# Patient Record
Sex: Female | Born: 1985 | Race: White | Hispanic: No | Smoking: Never smoker
Health system: Southern US, Community
[De-identification: ages and names within clinical notes are randomized; demographics above are authoritative.]

## PROBLEM LIST (undated history)

## (undated) DIAGNOSIS — F419 Anxiety disorder, unspecified: Secondary | ICD-10-CM

## (undated) DIAGNOSIS — R569 Unspecified convulsions: Secondary | ICD-10-CM

## (undated) DIAGNOSIS — A749 Chlamydial infection, unspecified: Secondary | ICD-10-CM

## (undated) DIAGNOSIS — I10 Essential (primary) hypertension: Secondary | ICD-10-CM

## (undated) DIAGNOSIS — K219 Gastro-esophageal reflux disease without esophagitis: Secondary | ICD-10-CM

## (undated) DIAGNOSIS — R519 Headache, unspecified: Secondary | ICD-10-CM

## (undated) DIAGNOSIS — F32A Depression, unspecified: Secondary | ICD-10-CM

## (undated) DIAGNOSIS — A549 Gonococcal infection, unspecified: Secondary | ICD-10-CM

## (undated) DIAGNOSIS — O009 Unspecified ectopic pregnancy without intrauterine pregnancy: Secondary | ICD-10-CM

## (undated) DIAGNOSIS — G4730 Sleep apnea, unspecified: Secondary | ICD-10-CM

## (undated) DIAGNOSIS — K7030 Alcoholic cirrhosis of liver without ascites: Secondary | ICD-10-CM

## (undated) DIAGNOSIS — T7840XA Allergy, unspecified, initial encounter: Secondary | ICD-10-CM

## (undated) DIAGNOSIS — J189 Pneumonia, unspecified organism: Secondary | ICD-10-CM

## (undated) HISTORY — DX: Gastro-esophageal reflux disease without esophagitis: K21.9

## (undated) HISTORY — DX: Allergy, unspecified, initial encounter: T78.40XA

## (undated) HISTORY — PX: COLPOSCOPY: SHX161

## (undated) HISTORY — DX: Headache, unspecified: R51.9

---

## 2011-09-17 ENCOUNTER — Emergency Department: Payer: Self-pay | Admitting: Emergency Medicine

## 2011-09-17 LAB — COMPREHENSIVE METABOLIC PANEL
Anion Gap: 25 — ABNORMAL HIGH (ref 7–16)
BUN: 31 mg/dL — ABNORMAL HIGH (ref 7–18)
Bilirubin,Total: 3 mg/dL — ABNORMAL HIGH (ref 0.2–1.0)
Chloride: 92 mmol/L — ABNORMAL LOW (ref 98–107)
Co2: 17 mmol/L — ABNORMAL LOW (ref 21–32)
Creatinine: 1.12 mg/dL (ref 0.60–1.30)
EGFR (African American): >60
EGFR (Non-African Amer.): >60
Glucose: 61 mg/dL — ABNORMAL LOW (ref 65–99)
Osmolality: 273 (ref 275–301)
Potassium: 4.1 mmol/L (ref 3.5–5.1)
Sodium: 134 mmol/L — ABNORMAL LOW (ref 136–145)
Total Protein: 8.8 g/dL — ABNORMAL HIGH (ref 6.4–8.2)

## 2011-09-17 LAB — CBC
HCT: 41.2 % (ref 35.0–47.0)
HGB: 14.1 g/dL (ref 12.0–16.0)
MCH: 37.4 pg — ABNORMAL HIGH (ref 26.0–34.0)
MCHC: 34.1 g/dL (ref 32.0–36.0)
MCV: 110 fL — ABNORMAL HIGH (ref 80–100)
RBC: 3.76 10*6/uL — ABNORMAL LOW (ref 3.80–5.20)
RDW: 13.9 % (ref 11.5–14.5)

## 2011-12-13 LAB — CBC
HCT: 33.3 % — ABNORMAL LOW (ref 35.0–47.0)
MCH: 37.1 pg — ABNORMAL HIGH (ref 26.0–34.0)
MCV: 109 fL — ABNORMAL HIGH (ref 80–100)
Platelet: 73 10*3/uL — ABNORMAL LOW (ref 150–440)
RBC: 3.07 10*6/uL — ABNORMAL LOW (ref 3.80–5.20)
WBC: 2.6 10*3/uL — ABNORMAL LOW (ref 3.6–11.0)

## 2011-12-13 LAB — COMPREHENSIVE METABOLIC PANEL
Albumin: 3 g/dL — ABNORMAL LOW (ref 3.4–5.0)
Alkaline Phosphatase: 150 U/L — ABNORMAL HIGH (ref 50–136)
Bilirubin,Total: 5.5 mg/dL — ABNORMAL HIGH (ref 0.2–1.0)
Calcium, Total: 8.5 mg/dL (ref 8.5–10.1)
Co2: 25 mmol/L (ref 21–32)
Creatinine: 0.57 mg/dL — ABNORMAL LOW (ref 0.60–1.30)
EGFR (African American): >60
EGFR (Non-African Amer.): >60
Glucose: 87 mg/dL (ref 65–99)
Potassium: 3.5 mmol/L (ref 3.5–5.1)
SGOT(AST): 254 U/L — ABNORMAL HIGH (ref 15–37)
Sodium: 140 mmol/L (ref 136–145)
Total Protein: 7.4 g/dL (ref 6.4–8.2)

## 2011-12-13 LAB — LIPASE, BLOOD: Lipase: 124 U/L (ref 73–393)

## 2011-12-14 ENCOUNTER — Inpatient Hospital Stay: Payer: Self-pay | Admitting: Internal Medicine

## 2011-12-14 LAB — URINALYSIS, COMPLETE
Leukocyte Esterase: NEGATIVE
Nitrite: NEGATIVE
Ph: 6 (ref 4.5–8.0)
Protein: 30
RBC,UR: 1 /HPF (ref 0–5)
Specific Gravity: 1.024 (ref 1.003–1.030)
WBC UR: 38 /HPF (ref 0–5)

## 2011-12-14 LAB — PHOSPHORUS: Phosphorus: 2.8 mg/dL (ref 2.5–4.9)

## 2011-12-14 LAB — IRON AND TIBC
Iron Saturation: 48 %
Unbound Iron-Bind.Cap.: 83 ug/dL

## 2011-12-14 LAB — MAGNESIUM: Magnesium: 1.2 mg/dL — ABNORMAL LOW

## 2011-12-14 LAB — HEPATIC FUNCTION PANEL A (ARMC)
Alkaline Phosphatase: 167 U/L — ABNORMAL HIGH (ref 50–136)
SGPT (ALT): 44 U/L
Total Protein: 7.2 g/dL (ref 6.4–8.2)

## 2011-12-14 LAB — PREGNANCY, URINE: Pregnancy Test, Urine: NEGATIVE m[IU]/mL

## 2011-12-15 LAB — CBC WITH DIFFERENTIAL/PLATELET
Eosinophil %: 0.9 %
HGB: 10.5 g/dL — ABNORMAL LOW (ref 12.0–16.0)
Lymphocyte #: 0.9 10*3/uL — ABNORMAL LOW (ref 1.0–3.6)
MCHC: 34.2 g/dL (ref 32.0–36.0)
Monocyte #: 0.2 10*3/uL (ref 0.0–0.7)
Monocyte %: 6.8 %
Neutrophil %: 54.3 %
Platelet: 70 10*3/uL — ABNORMAL LOW (ref 150–440)
WBC: 2.5 10*3/uL — ABNORMAL LOW (ref 3.6–11.0)

## 2011-12-15 LAB — HEPATIC FUNCTION PANEL A (ARMC)
Albumin: 2.6 g/dL — ABNORMAL LOW (ref 3.4–5.0)
Alkaline Phosphatase: 142 U/L — ABNORMAL HIGH (ref 50–136)
Bilirubin, Direct: 2.7 mg/dL — ABNORMAL HIGH (ref 0.00–0.20)
Bilirubin,Total: 3.6 mg/dL — ABNORMAL HIGH (ref 0.2–1.0)
SGOT(AST): 255 U/L — ABNORMAL HIGH (ref 15–37)
SGPT (ALT): 48 U/L
Total Protein: 6.8 g/dL (ref 6.4–8.2)

## 2011-12-15 LAB — BASIC METABOLIC PANEL
Anion Gap: 13 (ref 7–16)
BUN: 2 mg/dL — ABNORMAL LOW (ref 7–18)
Calcium, Total: 8.4 mg/dL — ABNORMAL LOW (ref 8.5–10.1)
EGFR (African American): >60
EGFR (Non-African Amer.): >60
Osmolality: 278 (ref 275–301)
Sodium: 142 mmol/L (ref 136–145)

## 2011-12-15 LAB — PROTIME-INR: INR: 1.8

## 2011-12-16 LAB — CBC WITH DIFFERENTIAL/PLATELET
Basophil #: 0 10*3/uL (ref 0.0–0.1)
Basophil %: 0.4 %
HCT: 29.5 % — ABNORMAL LOW (ref 35.0–47.0)
Lymphocyte #: 0.9 10*3/uL — ABNORMAL LOW (ref 1.0–3.6)
Lymphocyte %: 36.7 %
MCH: 37.3 pg — ABNORMAL HIGH (ref 26.0–34.0)
MCHC: 34.1 g/dL (ref 32.0–36.0)
MCV: 109 fL — ABNORMAL HIGH (ref 80–100)
Monocyte %: 8 %
Neutrophil #: 1.2 10*3/uL — ABNORMAL LOW (ref 1.4–6.5)
Neutrophil %: 53.6 %
Platelet: 66 10*3/uL — ABNORMAL LOW (ref 150–440)
RBC: 2.7 10*6/uL — ABNORMAL LOW (ref 3.80–5.20)
RDW: 19.8 % — ABNORMAL HIGH (ref 11.5–14.5)

## 2011-12-16 LAB — BASIC METABOLIC PANEL
Anion Gap: 12 (ref 7–16)
BUN: 2 mg/dL — ABNORMAL LOW (ref 7–18)
Calcium, Total: 8.1 mg/dL — ABNORMAL LOW (ref 8.5–10.1)
Creatinine: 0.62 mg/dL (ref 0.60–1.30)
Glucose: 74 mg/dL (ref 65–99)

## 2011-12-16 LAB — HEPATIC FUNCTION PANEL A (ARMC)
Albumin: 2.5 g/dL — ABNORMAL LOW (ref 3.4–5.0)
SGPT (ALT): 49 U/L
Total Protein: 6.3 g/dL — ABNORMAL LOW (ref 6.4–8.2)

## 2011-12-16 LAB — PROTIME-INR: Prothrombin Time: 20.7 secs — ABNORMAL HIGH (ref 11.5–14.7)

## 2011-12-19 ENCOUNTER — Ambulatory Visit: Payer: Self-pay | Admitting: Unknown Physician Specialty

## 2012-01-10 ENCOUNTER — Ambulatory Visit: Payer: Self-pay | Admitting: Unknown Physician Specialty

## 2012-02-09 LAB — RAPID URINE DRUG SCREEN, HOSP PERFORMED
Benzodiazepine, Ur Scrn: POSITIVE (ref ?–200)
Cannabinoid 50 Ng, Ur ~~LOC~~: POSITIVE (ref ?–50)
Opiate, Ur Screen: NEGATIVE (ref ?–300)

## 2012-02-10 ENCOUNTER — Ambulatory Visit: Payer: Self-pay | Admitting: Unknown Physician Specialty

## 2012-03-25 DIAGNOSIS — F419 Anxiety disorder, unspecified: Secondary | ICD-10-CM | POA: Insufficient documentation

## 2012-10-04 ENCOUNTER — Ambulatory Visit: Payer: Self-pay | Admitting: Unknown Physician Specialty

## 2013-08-01 ENCOUNTER — Emergency Department: Payer: Self-pay | Admitting: Emergency Medicine

## 2013-08-01 LAB — CBC WITH DIFFERENTIAL/PLATELET
Basophil #: 0 10*3/uL (ref 0.0–0.1)
Basophil %: 0.5 %
Eosinophil #: 0.1 10*3/uL (ref 0.0–0.7)
Eosinophil %: 1.8 %
HCT: 38.7 % (ref 35.0–47.0)
Lymphocyte #: 1.8 10*3/uL (ref 1.0–3.6)
Lymphocyte %: 27.5 %
MCHC: 35.3 g/dL (ref 32.0–36.0)
Monocyte #: 0.5 x10 3/mm (ref 0.2–0.9)
Monocyte %: 7 %
Neutrophil %: 63.2 %
Platelet: 181 10*3/uL (ref 150–440)
RBC: 4.2 10*6/uL (ref 3.80–5.20)

## 2013-08-01 LAB — COMPREHENSIVE METABOLIC PANEL WITH GFR
Albumin: 4.1 g/dL
Alkaline Phosphatase: 91 U/L
Anion Gap: 5 — ABNORMAL LOW
BUN: 22 mg/dL — ABNORMAL HIGH
Bilirubin,Total: 0.7 mg/dL
Calcium, Total: 9.2 mg/dL
Chloride: 107 mmol/L
Co2: 25 mmol/L
Creatinine: 0.65 mg/dL
EGFR (African American): >60
EGFR (Non-African Amer.): >60
Glucose: 112 mg/dL — ABNORMAL HIGH
Osmolality: 278
Potassium: 4 mmol/L
SGOT(AST): 31 U/L
SGPT (ALT): 31 U/L
Sodium: 137 mmol/L
Total Protein: 7.6 g/dL

## 2013-09-11 HISTORY — PX: CHOLECYSTECTOMY: SHX55

## 2013-10-06 DIAGNOSIS — K219 Gastro-esophageal reflux disease without esophagitis: Secondary | ICD-10-CM | POA: Insufficient documentation

## 2013-10-06 DIAGNOSIS — Z516 Encounter for desensitization to allergens: Secondary | ICD-10-CM | POA: Insufficient documentation

## 2013-10-06 DIAGNOSIS — J302 Other seasonal allergic rhinitis: Secondary | ICD-10-CM | POA: Insufficient documentation

## 2014-07-31 ENCOUNTER — Ambulatory Visit: Payer: Self-pay | Admitting: Family Medicine

## 2014-08-26 ENCOUNTER — Ambulatory Visit: Payer: Self-pay | Admitting: Surgery

## 2014-08-28 ENCOUNTER — Ambulatory Visit: Payer: Self-pay | Admitting: Surgery

## 2015-01-02 NOTE — Op Note (Signed)
PATIENT NAME:  [name], [name] MR#:  [id number] DATE OF BIRTH:  [birth year]  DATE OF PROCEDURE:  08/28/2014  PREOPERATIVE DIAGNOSIS: Chronic cholecystitis/cholelithiasis.   POSTOPERATIVE DIAGNOSIS: Chronic cholecystectomy/cholelithiasis.    PROCEDURE: Laparoscopic cholecystectomy and cholangiogram.   SURGEON: Adella HareJ. Wilton Rusnak, MD   ANESTHESIA: General.   INDICATIONS: This 29 year old female has a history of the right upper quadrant abdominal pains and ultrasound findings of a gallstone and moderate elevation of bilirubin. Recent MRCP demonstrated no common bile duct stone. Surgery was recommended for definitive treatment.   DESCRIPTION OF PROCEDURE: The patient was placed on the operating table in the supine position under general anesthesia. The abdomen was prepared with ChloraPrep and draped in a sterile manner. A short incision was made in the inferior aspect of the umbilicus and carried down to the deep fascia, which was grasped with laryngeal hook and elevated. A Veress needle was inserted, aspirated, and irrigated with a saline solution. Next, the peritoneal cavity was inflated with carbon dioxide. The Veress needle was removed. The 10 mm cannula was inserted. The 10 mm, 0 degree laparoscope was inserted to view the peritoneal cavity. Initial inspection revealed a smooth surface of the liver and some white discoloration of the fundus of the gallbladder. A brief survey of the stomach and intestines appeared typical.   Next, another incision was made in the epigastrium, slightly to the right of the midline, to introduce an 11 mm cannula. Two incisions were made in the lateral aspect of the right upper quadrant to introduce two 5 mm cannulas. With the patient in the reverse Trendelenburg position and turned several degrees towards the left. The gallbladder was retracted towards the right shoulder. The infundibulum was retracted inferiorly and laterally. The porta hepatis was demonstrated. It was  further noted that this liver surface appeared smooth and the liver was soft. Next, dissection was carried out, mobilizing the gallbladder neck with incision of the visceral peritoneum. The cystic duct was dissected free from surrounding structures. The cystic artery was dissected free from surrounding structures. A critical view of safety was demonstrated.   An Endo Clip was placed across the cystic duct adjacent to the neck of the gallbladder. An incision was made in the cystic duct to introduce a Reddick catheter. Half-strength Conray-60 dye was injected, as the cholangiogram was done with fluoroscopy. Viewing the biliary tree and prompt flow of dye into the duodenum, no retained stones were seen. The cholangiogram appeared normal.   The Reddick catheter was removed. The cystic duct was doubly ligated with Endoclips and divided. The cystic artery was controlled with double Endoclips and divided. The gallbladder was dissected free from the liver with hook and cautery. There was no significant bleeding. The gallbladder was completely separated and was delivered up through the infraumbilical port, opened and suctioned, removed and submitted with a small palpable stone for routine pathology. The right upper quadrant was further inspected. Hemostasis was intact. The cannulas were removed. Carbon dioxide was allowed to escape from the peritoneal cavity. Several small subcutaneous bleeding points were cauterized. The skin incisions were closed with interrupted 5-0 chromic subcuticular suture, benzoin, and Steri-Strips. Dressings were applied with paper tape.   The patient tolerated surgery satisfactorily and was then prepared for transfer to the recovery room.    ____________________________ Shela CommonsJ. Renda RollsWilton Fera, MD jws:MT D: 08/28/2014 14:23:03 ET T: 08/28/2014 21:05:04 ET JOB#: [id number]  cc: Adella HareJ. Wilton Gelb, MD, <Dictator> Adella HareWILTON J Yankowski MD ELECTRONICALLY SIGNED 08/31/2014 10:46
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
## 2015-01-03 NOTE — Consult Note (Signed)
Spoke to patient about alcohol and the damage she is doing to her liver.  Patient very manic and delusional thinking people are trying to kill her.  She bolted for the stairs and required staff to bring her back to room and code 300 called.  Psy notified of stat consult. No specific liver treatents at this time.  Check phosphorous and magnesium levels.   Electronic Signatures: Scot JunElliott, Robert T (MD)  (Signed on 04-Apr-13 15:59)  Authored  Last Updated: 04-Apr-13 15:59 by Scot JunElliott, Robert T (MD)

## 2015-01-03 NOTE — Consult Note (Signed)
Chief Complaint:   Subjective/Chief Complaint feeling some better today, denies nausea, tolerting po, denies abdominal pain. tolerating po   Brief Assessment:   Cardiac Regular    Respiratory clear BS    Gastrointestinal details normal Soft  Nontender  Nondistended  No masses palpable  Bowel sounds normal   Hepatic:  03-Apr-13 19:52    Bilirubin, Total 5.5    23:40    Bilirubin, Total 5.1   Bilirubin, Direct 3.2  Routine Coag:  04-Apr-13 23:40    INR 1.8  Routine Hem:  05-Apr-13 05:10    WBC (CBC) 2.5   RBC (CBC) 2.85   Hemoglobin (CBC) 10.5   Hematocrit (CBC) 30.8   Platelet Count (CBC) 70   MCV 108   MCH 36.9   MCHC 34.2   RDW 19.3  Routine Chem:  05-Apr-13 05:10    Glucose, Serum 68   BUN 2   Creatinine (comp) 0.64   Sodium, Serum 142   Potassium, Serum 3.1   Chloride, Serum 104   CO2, Serum 25   Calcium (Total), Serum 8.4  Hepatic:  05-Apr-13 05:10    Bilirubin, Total 3.6   Alkaline Phosphatase 142   SGPT (ALT) 48   SGOT (AST) 255   Total Protein, Serum 6.8   Albumin, Serum 2.6  Routine Chem:  05-Apr-13 05:10    Osmolality (calc) 278   eGFR (African American) >60   eGFR (Non-African American) >60   Anion Gap 13  Hepatic:  05-Apr-13 05:10    Bilirubin, Direct 2.7  Routine Coag:  05-Apr-13 05:10    Prothrombin 21.1   INR 1.8  Routine Hem:  05-Apr-13 05:10    Neutrophil % 54.3   Lymphocyte % 37.4   Monocyte % 6.8   Eosinophil % 0.9   Basophil % 0.6   Neutrophil # 1.3   Lymphocyte # 0.9   Monocyte # 0.2   Eosinophil # 0.0   Basophil # 0.0   Assessment/Plan:  Assessment/Plan:   Assessment 1) etoh related hepatopathy-some improvement of bili, stable pt, now on vit K to check responsiveness. please see full GI note earlier    Plan 1) continue supportive care.  appreciate psych assistance.   Electronic Signatures: Loistine Simas (MD)  (Signed 06-Apr-13 00:42)  Authored: Chief Complaint, Brief Assessment, Lab Results,  Assessment/Plan   Last Updated: 06-Apr-13 00:42 by Loistine Simas (MD)
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
## 2015-01-03 NOTE — H&P (Signed)
PATIENT NAME:  [name], [name] MR#:  [id number] DATE OF BIRTH:  11-28-1985  DATE OF ADMISSION:  12/14/2011  CHIEF COMPLAINT: Generalized weakness, fatigue.  HISTORY OF PRESENT ILLNESS: Ms. [name] is a 29 year old female with alcoholic liver cirrhosis who has her primary doctor, Dr. Warnell Forester at [facility]. Patient has been followed by [facility], was recently diagnosed with alcoholic liver disease. Patient has history of heavy alcohol abuse, started drinking recently only last three years but drinks up to one pint of vodka per day and she relates this to stress at job. Upon admission patient had basic workup done which did show ammonia level of 49 and albumin level of 3 and total bilirubin of 5.5 and alkaline phosphatase of 150 and AST of 254 as well did show pancytopenia with WBCs of 2.6, hemoglobin of 11.4 and platelet count of 73 and INR level of 1.8. These labs certainly do look worse than her previous labs mainly for INR which was elevated from 1.3 to 1.8 and total bilirubin which was 3.4 at [facility] and elevated 5.5.   As well patient was complaining of abdominal tenderness which she reports much improved by now. She reports she had lower extremity edema over the last couple of days but as well did resolve recently as per patient and she feels slightly confused and lethargic.   PAST MEDICAL HISTORY: 1. Alcoholic liver cirrhosis.  2. Morbid obesity status post 90 pound weight loss over a year. 3. History of seizures. 4. History of diabetes. 5. History of hypertension, currently not taking any medication, controlled with diet.   HOME MEDICATIONS: 1. Xanax 0.25 mg t.i.d. 2. Propranolol 10 mg p.o. q.i.d.  3. Omeprazole 20 mg p.o. daily.  SOCIAL HISTORY: Patient lives with her husband. 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 She reports history of heavy drinking over the last year, up to one pint of vodka daily. She smokes marijuana occasionally. She denies any drug abuse or  any smoking.   FAMILY HISTORY: Significant for hypertension in patient's father.   ALLERGIES: NyQuil Cold and Flu.   REVIEW OF SYSTEMS: CONSTITUTIONAL: Patient denies any fever. Complains of weakness, fatigue. EYES: Denies any blurred vision, double vision. ENT: Denies any tinnitus, ear pain or hearing loss. RESPIRATORY: Denies any cough, wheezing, hemoptysis. CARDIOVASCULAR: Denies any chest pain, palpitations, syncope. Complains of worsening edema. GASTROINTESTINAL: Denies any nausea, vomiting, diarrhea. Complains of abdominal pain. ENDOCRINE: Denies any polyuria, polydipsia, heat or cold intolerance. HEMATOLOGY: Denies any easy bruising, bleeding diathesis. SKIN: Complains of rash,itching. Denies any acne. NEUROLOGICAL: Denies any numbness, weakness. Has history of seizure. Denies any vertigo.    PHYSICAL EXAMINATION: VITAL SIGNS: Temperature 98.6, pulse 77, respiratory rate 16, blood pressure 118/74, saturation 98% on room air.  GENERAL: Awake, alert young female sitting comfortable in bed in no apparent distress.  HEENT: Head atraumatic, normocephalic. Pupils are equal, round, and reactive to light. Has icteric sclera. Moist oral mucosa.  NECK: Supple. No thyromegaly. No JVD.  CHEST: Good air entry bilaterally. No wheezing, rales, rhonchi.   CARDIOVASCULAR: S1, S2. No rubs, murmur, gallops.   ABDOMEN: Soft, nontender, nondistended. Bowel sounds present. No ascites could be felt.   EXTREMITIES: No edema. No clubbing. No cyanosis. Motor strength 5/5 in all extremities.   PSYCH: Appropriate affect. Mildly lethargic. Comfortable. Awake, alert x3.   LABORATORY, DIAGNOSTIC AND RADIOLOGICAL DATA: Glucose 87, BUN 2, creatinine 0.57, sodium 140, potassium 3.5, chloride 100, carbon dioxide 25, ammonia 49, total protein 7.4,  albumin 3, total bilirubin 5.5, alkaline phosphatase 150, AST 254, ALT 47, WBC 2.6, hemoglobin 11.4, platelets 73, INR 1.8.   CT abdomen and pelvis: Mild intralobular  septal thickening in the lung bases which can be seen with pulmonary edema. Fatty infiltration of the liver with associated hepatomegaly. Small amount of ascites in the abdomen and pelvis. Splenomegaly. Mild diffuse subcutaneous edema suggesting anasarca.   ASSESSMENT AND PLAN:  1. Alcoholic liver disease. Patient has known history of alcoholic liver disease secondary to alcohol consumption, [facility]. Patient appears to be having alcoholic liver disease as her INR is elevated, albumin level is low and total bilirubin increased from 3 to 5.5. Patient was counseled about quitting alcohol. Will obtain ultrasound of the liver. Will consult GI services. Will continue patient on propranolol. Will add Lactulose as patient had elevated ammonia. 2. Metabolic encephalopathy. Patient has lethargy and confusion and this might be attributed to her elevated ammonia level. Started on Lactulose.  3. Thrombocytopenia. This is most likely secondary to splenomegaly. Will hold chemical anticoagulation. 4. Alcohol abuse. Patient was counseled and will start alcohol withdrawal protocol.  5. CODE STATUS: Patient is FULL CODE.   ____________________________ Starleen Armsawood S. Yola Paradiso, MD dse:cms D: 12/14/2011 07:29:19 ET T: 12/14/2011 08:44:52 ET JOB#: [id number]  cc: Starleen Armsawood S. [employer]aye [employer], MD, <Dictator> Dr. Warnell ForesterKimberly Kyliste [facility] Kerekes S Carle Dargan MD ELECTRONICALLY SIGNED 12/23/2011 1:24

## 2015-01-03 NOTE — Consult Note (Signed)
Brief Consult Note: Diagnosis: delirium prob largely due to alcohol withdrawl and also some hepatic encephalopathy.   Patient was seen by consultant.   Consult note dictated.   Recommend further assessment or treatment.   Orders entered.   Comments: Psychiatry: Patient seen. 29yo woman here for eval and treatment of liver disease has rapidly developed agitation. Became paranoid and agitated, tried to elope, pulled out IV. Pt indicates she has heard some things that are prob hallucinations and got suddenly paranoid, but is not showing any formed delusions and not making any threats. Confusion seems to wax and wane. Bottom line, most likely delirium tremons worsened by her hepatic disease. Best treatment is aggressive ativan to acheive calm but not overly sedated state and keep her there for at least a day or two. Orders done. Discussed with nursing team. I'd suggest letting her husband stay with her. Will follow.  Electronic Signatures: Audery Amellapacs, John T (MD)  (Signed 04-Apr-13 16:31)  Authored: Brief Consult Note   Last Updated: 04-Apr-13 16:31 by Audery Amellapacs, John T (MD)

## 2015-01-03 NOTE — Discharge Summary (Signed)
PATIENT NAME:  [name], [name] MR#:  [id number] DATE OF BIRTH:  Nov 01, 1985  DATE OF ADMISSION:  12/14/2011 DATE OF DISCHARGE:  12/16/2011  ADMITTING PHYSICIAN: Dr. Huey Bienenstock DISCHARGING PHYSICIAN: Dr. Enid Baas  PRIMARY CARE PHYSICIAN: None  CONSULTATIONS IN THE HOSPITAL:  1. GI consultation by Dr. Mechele Collin.  2. Psychiatric consultation by Dr. Toni Amend.  DISCHARGE DIAGNOSES:  1. Progressively worsening alcoholic liver disease.  2. Delirium tremens.  3. Ongoing alcohol abuse.  4. Acquired coagulopathy.  5. Hepatic encephalopathy.  6. Chronic thrombocytopenia.  7. Splenomegaly. 8. Portal hypertension.   DISCHARGE HOME MEDICATIONS:  1. Propranolol 10 mg p.o. t.i.d.  2. Lactulose 30 mL p.o. b.i.d.  3. Aldactone 25 mg p.o. daily.  4. Melatonin 3 mg p.o. at bedtime for sleep.   DISCHARGE DIET: Low sodium diet.   DISCHARGE ACTIVITY: As tolerated.   FOLLOW UP INSTRUCTIONS:  1. Primary care physician follow up in 1 to 2 weeks.  2. Alcohol abuse help with outpatient program as recommended by substance abuse counselor prior to discharge.   LABORATORY, DIAGNOSTIC AND RADIOLOGICAL DATA:  INR 1.7 prior to discharge.   Sodium 144, potassium 3.2, chloride 106, bicarbonate 26, BUN 2, creatinine 0.62, glucose 94, calcium 8.1.   Total bilirubin 3.2, with direct bilirubin 2.4, ALT 49, AST 252, alkaline phosphatase 141 and albumin 2.5.   WBC 2.3, hemoglobin 10.1, hematocrit 29.2, platelet count 66. Plasma ammonia slightly elevated at 38.   Abdominal ultrasound showing gallbladder wall thickening, which could be secondary to ascites. No further evidence of any cholecystitis present. Cirrhotic changes in the liver and also findings concerning for portal hypertension particularly with enlarged spleen.   Serum iron 71, iron binding capacity 160. ANA negative. Chest x-ray showing no pneumonia or congestive heart failure. Hyperinflation is present.   CT of the abdomen and pelvis  showing diffuse fatty infiltration of liver, splenomegaly, hepatobiliary disease could be considered, ascites, anasarca and mild thickening of right transverse colon. Colitis cannot be excluded.   Urinalysis negative for any infection. Pregnancy test is negative. Lipase 124.   BRIEF HOSPITAL COURSE: Ms. [name] is a 29 year old young Caucasian female with significant history of alcohol abuse resulting in alcoholic liver disease, cirrhosis, portal hypertension with splenomegaly who follows up at [facility] comes to the hospital complaining of generalized weakness and also fatigue. Her LFTs were slightly elevated on admission which have come down to her baseline.   No changes have been made. She was found to be in acquired coagulopathy with elevated INR so vitamin K subcutaneous three doses were given while in the hospital. She had an acute episode of delirium after she got upset talking to GI physician and tried to elope. She was seen by psychiatry who recommended observation for another 24 to 48 hours for possible delirium tremens as patient also having some hallucinations at the time. After that episode her course has been completely uneventful. She has not required much Ativan and has not gone into withdrawal symptoms. Her appetite has improved and as mentioned above labs are at baseline. Slightly elevated ammonia level so started on lactulose prior to discharge. Also significantly counseled against alcohol abuse and she acknowledged her problem and was motivated to seek help as recommended by substance abuse counselor while inpatient. She is also started on spironolactone for ascites and also propranolol because of increased portal pressures and possible variceal disease. Course has been otherwise uneventful.   DISCHARGE CONDITION: Stable.   DISCHARGE DISPOSITION: Home.   TIME SPENT ON DISCHARGE: 45  minutes.  ____________________________ Enid Baasadhika Thresia Ramanathan, MD rk:cms D: 12/17/2011 12:17:14  ET T: 12/18/2011 14:26:01 ET JOB#: [id number]  cc: Enid Baasadhika Ramia Sidney, MD, <Dictator> Enid BaasADHIKA Jamarian Jacinto MD ELECTRONICALLY SIGNED 12/20/2011 13:43
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
## 2015-01-03 NOTE — Consult Note (Signed)
Brief Consult Note: Diagnosis: Decompensated alcoholic liver disease  Patient was seen by consultant.   Consult note dictated.  Electronic Signatures: Rowan BlaseMills, Alexiz Cothran Ann (NP)  (Signed 04-Apr-13 13:37)  Authored: Brief Consult Note   Last Updated: 04-Apr-13 13:37 by Rowan BlaseMills, Jacarius Handel Ann (NP)

## 2015-01-03 NOTE — Consult Note (Signed)
PATIENT NAME:  Martha, York MR#:  [id number] DATE OF BIRTH:  [birth year]  DATE OF CONSULTATION:  12/14/2011  REFERRING PHYSICIAN:  Dr. Randol Kern CONSULTING PHYSICIAN:  Dr. Molly Maduro Elliott/ Cala Bradford A. Arvilla Market, ANP  REASON FOR CONSULTATION: Alcoholic liver cirrhosis.   HISTORY OF PRESENT ILLNESS: This 29 year old patient of Dr. Warnell Forester, [facility], is followed for alcoholic liver disease. The patient reports alcoholic use started three years ago, and drinks up to 1 pint of vodka daily. She drinks shots of vodka followed by Crystal light. The patient reports she was sober the entire month of February but restarted alcohol again last month. On Monday 12/11/2011 she drank three times as much as she normally did all in one day. She reports the cause of this drinking was frustration at work. Monday she felt a little sweaty. Tuesday she noted leg edema and some progressive abdominal discomfort and felt that it was hard to breathe. She presented to the hospital with ammonia level 49, and decompensated alcoholic cirrhosis. The patient was subsequently admitted, she was placed on CIWA protocol, and gastroenterology has been asked to see the patient for further evaluation and management.   Currently, the patient had a CT scan, has been on lactulose, and has reported a couple of loose stools. Abdomen is feeling overall better. The edema is down. She says she is having no significant pain. The patient believes she vomited once today. She has seen no blood or melena. Denies history of upper endoscopy. The patient reports bad anxiety and OCD and has been followed by psychiatrist Dr. Pricilla Holm for about seven years at [facility].  She denies history of bipolar depression. She had one seizure episode in 2012 and she relates that to being on chronic Wellbutrin since age 77. The patient does have a history of polysubstance abuse, but reports currently the only drugs she is using are occasional marijuana and alcohol. No prior  hospitalizations at this facility and we do not have records from [facility] at the time of consultation.   PAST MEDICAL HISTORY:  1. Alcoholic liver cirrhosis.  2. Morbid obesity, reported 90-pound weight loss over a year.  3. History of seizure times one April 2012 per patient report.  4. The patient denies diabetes history.  5. History of hypertension on no medication.  6. History of psychiatric disorder. The patient reports severe anxiety and OCD, and was placed on Wellbutrin at age 81. The patient has used Klonopin in the past and presents on Xanax  as therapy. The patient states she is going to be looking for another psychiatrist.   PAST SURGICAL HISTORY: None listed.   HOME MEDICATIONS:  1. Xanax 0.25 mg t.i.d.  2. Propranolol 10 mg p.o. q.i.d.  3. Omeprazole 20 mg daily.   ALLERGIES: NyQuil Cold and Flu- causes seizures.   SOCIAL HISTORY: Patient lives with her husband, reports no children. She used to work for [employer], states she quit her job yesterday. Positive acid uses at age 20. Positive cocaine in the past, quit at age 74. She denies history of IV drug use past or present. She has occasional marijuana use. Drinks up to 1 pint of vodka daily. Denies tobacco use.   FAMILY HISTORY: Mother drinks wine daily, father drinks occasional Christiane Ha. She denies family history of liver disease.   REVIEW OF SYSTEMS: 10 systems reviewed and the patient is a wandering historian. The patient is confused, goes on tangents, and is difficult to follow. She denies any headaches, seizure activity, or  change in vision. Mouth is dry. No chest pain, shortness of breath, or cough. Abdomen is more uncomfortable, has been more distended. Denies any nausea or vomiting currently. Bowel movements currently loose. She has noted no blood or melena or change in habits before admission. The patient denies any dysuria or hematuria. Main concern has been swelling in the legs and abdomen. Leg  swelling has decreased since admission. She is noticing easy bruising, has noted no blood in the urine or bleeding gums. No joint pain, swelling, or skin rashes. The patient reports her mental status is more anxious than normal but not too far off baseline. Denies suicidal ideation. Denies history of bipolar depression. The patient admits that she has alcohol problems and associates it with stress related to work.   PHYSICAL EXAMINATION:  VITAL SIGNS: Temperature 98.8, pulse 85, respirations 20, blood pressure 138/99, pulse oximetry on room air 98%.   GENERAL: Obese Caucasian female sitting cross-legged in bed, in no acute distress.   HEENT: Head is normocephalic. Conjunctivae a little injected, positive scleral icterus. Oral mucosa is dry. Tongue is red, coated with white, maybe thrush.   NECK: Supple without lymphadenopathy or thyromegaly.   CARDIAC: S1, S2 without murmur or gallop.   LUNGS: Clear to auscultation bilaterally, respirations are eupneic.   ABDOMEN: Soft, positive ascites, positive normal bowel sounds. Mild tenderness in the right upper quadrant. Fullness in the right upper quadrant felt. No nodules or discrete mass.   RECTAL:  Exam is deferred.   EXTREMITIES: Edema. Positive jaundice, bruising. No cyanosis.   MUSCULOSKELETAL: Strength is equal bilaterally. She is ambulatory. Gait not evaluated. No joint pain, swelling noted.   NEURO: The patient is without tremors, seizure activity. Cranial nerves II through XII grossly intact.   PSYCH: The patient does not appear lethargic. She does appear anxious. Speech is fast. She is an extreme wandering historian with questions. She cannot stay on topic, speaks very quickly, and goes off into conversation that has nothing to do with the question asked. The patient has to be repeatedly redirected to the question, and then she will eventually answer it. Speech pattern to consider a manic state. The patient's extremities are still, she is  sitting in bed, not fidgeting. The patient does respond to question redirection well, and is cooperative and pleasant.   LABORATORY, DIAGNOSTIC, AND RADIOLOGICAL DATA: Admission glucose 87, BUN 2, creatinine 0.57. Electrolytes unremarkable. Lipase 124, ammonia level 49, albumin 3, down from 4.4, total bilirubin 5.5 to 5.1, direct bilirubin 3.2, alkaline phosphatase 167, AST 255, ALT 44, hemoglobin 11.4, WBC 2.6, platelet count 73, MCV 109, MCH 37.1. ProTime 21.2, INR 1.8, PTT 38.1. Urinalysis: Red, cloudy, positive for bilirubin, protein, WBCs 38 per high-power field, positive for mucus, hyaline casts, and epithelial cells.   [facility] with BUN 31, creatinine 1.12, sodium 134, total bilirubin 3, albumin 4.4, alkaline phosphatase 146, AST 487, ALT 130, WBC 5.5, hemoglobin 14.1, platelet count 75.   RADIOLOGY: CT scan with fatty infiltration of the liver. Splenomegaly. Ascites. Anasarca. Mild wall thickening of the transverse right colon. Colitis cannot be excluded.   IMPRESSION:  1. The patient with known alcoholic liver disease presents with decompensated alcoholic cirrhosis. She had an acute binge of alcohol consumption reported two days ago, and presents with worsening liver labs. Possibility of acute alcoholic hepatitis superimposed on chronic alcoholic cirrhosis.  2. Psychiatric disease per patient, reports anxiety and depression.  Her affect and speech today are concerning for possibility  of manic expression.  3. Abnormal CT scan of the liver to be as expected with alcoholic liver disease and also mention of mild wall thickening transverse right colon. The patient has had constipation; she is on lactulose. She had a CT scan done. She is having loose stool today. She denies diarrhea prior to admission.   PLAN:  1. Laboratory studies recommended for ANA, hereditary hemochromatosis, and ceruloplasmin, rule out of other underlying liver disease. We will check a.m.  liver labs, PT and INR.  Chest x-ray now. Magnesium level, phosphate level, HIV today. The patient has multivitamin, thiamine, and folate supplement IV already ordered. She is on chronic propranolol. We will keep her on that. IV Protonix b.i.d. recommended. Decrease in hemoglobin noted. She denies history of esophageal bleed or previous EGD.  We will not order EGD during this acute phase.  2. Recommend psychiatric evaluation to consider diagnosis of bipolar depression or mania. The patient acknowledges she is in the hospital because of her alcohol abuse, blames it on her work, and is of the opinion that she can quit drinking when she leaves like she did in February.  3. Psychiatry may help with alcoholic abuse as well.   The case was discussed with Dr. Mechele Collin in collaboration of care. Gastroenterology to continue to follow the patient with you. Dr. Barnetta Chapel will be covering tomorrow.        These services provided by Amedeo Kinsman, MS, APRN, [facility], ANP under collaborative  agreement with Dr. Lynnae Prude.    ____________________________ Ranae Plumber. Arvilla Market, ANP kam:bjt D: 12/14/2011 15:32:41 ET T: 12/14/2011 16:24:06 ET JOB#: [id number]  cc: Cala Bradford A. Arvilla Market, ANP, <Dictator> Ranae Plumber. Suzette Battiest, MSN, ANP-BC Adult Nurse Practitioner ELECTRONICALLY SIGNED 12/15/2011 12:06

## 2015-01-04 LAB — SURGICAL PATHOLOGY

## 2015-02-19 LAB — HM HIV SCREENING LAB: HM HIV Screening: NEGATIVE

## 2015-09-12 NOTE — L&D Delivery Note (Signed)
Delivery Note Pt pushed for an hour and a half  At 6:25 PM a viable female was delivered via Vaginal, Spontaneous Delivery (Presentation:ROA ;  ).  APGAR:8, 9 ; weight pending Placenta status: delivers intact, shultz, .  Cord:  with the following complications: none .   Anesthesia: epidural  Episiotomy:  none Lacerations:  Second degree Suture Repair: 2.0 vicryl Est. Blood Loss (mL): 150  Mom to postpartum.  Baby to Couplet care / Skin to Skin.  [name] 08/03/2016, 6:51 PM

## 2016-08-01 ENCOUNTER — Inpatient Hospital Stay (HOSPITAL_COMMUNITY)
Admission: AD | Admit: 2016-08-01 | Discharge: 2016-08-05 | DRG: 775 | Disposition: A | Discharge status: Home / Self Care | Payer: Medicaid Other | Source: Ambulatory Visit | Attending: Obstetrics and Gynecology | Admitting: Obstetrics and Gynecology

## 2016-08-01 ENCOUNTER — Encounter (HOSPITAL_COMMUNITY): Payer: Self-pay | Admitting: *Deleted

## 2016-08-01 DIAGNOSIS — Z3A38 38 weeks gestation of pregnancy: Secondary | ICD-10-CM

## 2016-08-01 DIAGNOSIS — O134 Gestational [pregnancy-induced] hypertension without significant proteinuria, complicating childbirth: Secondary | ICD-10-CM | POA: Diagnosis present

## 2016-08-01 DIAGNOSIS — Z6839 Body mass index (BMI) 39.0-39.9, adult: Secondary | ICD-10-CM

## 2016-08-01 DIAGNOSIS — O99214 Obesity complicating childbirth: Secondary | ICD-10-CM | POA: Diagnosis present

## 2016-08-01 DIAGNOSIS — O133 Gestational [pregnancy-induced] hypertension without significant proteinuria, third trimester: Secondary | ICD-10-CM | POA: Diagnosis present

## 2016-08-01 HISTORY — DX: Unspecified convulsions: R56.9

## 2016-08-01 HISTORY — DX: Chlamydial infection, unspecified: A74.9

## 2016-08-01 HISTORY — DX: Gonococcal infection, unspecified: A54.9

## 2016-08-01 HISTORY — DX: Anxiety disorder, unspecified: F41.9

## 2016-08-01 HISTORY — DX: Essential (primary) hypertension: I10

## 2016-08-01 LAB — COMPREHENSIVE METABOLIC PANEL
ALBUMIN: 2.9 g/dL — AB (ref 3.5–5.0)
ALT: 12 U/L — ABNORMAL LOW (ref 14–54)
ANION GAP: 9 (ref 5–15)
AST: 19 U/L (ref 15–41)
Alkaline Phosphatase: 121 U/L (ref 38–126)
BILIRUBIN TOTAL: 0.6 mg/dL (ref 0.3–1.2)
BUN: 8 mg/dL (ref 6–20)
CHLORIDE: 105 mmol/L (ref 101–111)
CO2: 21 mmol/L — ABNORMAL LOW (ref 22–32)
Calcium: 9.3 mg/dL (ref 8.9–10.3)
Creatinine, Ser: 0.6 mg/dL (ref 0.44–1.00)
GFR calc Af Amer: >60 mL/min (ref >60)
GFR calc non Af Amer: >60 mL/min (ref >60)
GLUCOSE: 98 mg/dL (ref 65–99)
POTASSIUM: 3.7 mmol/L (ref 3.5–5.1)
Sodium: 135 mmol/L (ref 135–145)
TOTAL PROTEIN: 6.2 g/dL — AB (ref 6.5–8.1)

## 2016-08-01 LAB — OB RESULTS CONSOLE HIV ANTIBODY (ROUTINE TESTING): HIV: NONREACTIVE

## 2016-08-01 LAB — CBC
HEMATOCRIT: 34.3 % — AB (ref 36.0–46.0)
HEMOGLOBIN: 12 g/dL (ref 12.0–15.0)
MCH: 32.5 pg (ref 26.0–34.0)
MCHC: 35 g/dL (ref 30.0–36.0)
MCV: 93 fL (ref 78.0–100.0)
Platelets: 216 10*3/uL (ref 150–400)
RBC: 3.69 MIL/uL — AB (ref 3.87–5.11)
RDW: 13.6 % (ref 11.5–15.5)
WBC: 8.8 10*3/uL (ref 4.0–10.5)

## 2016-08-01 LAB — TYPE AND SCREEN
ABO/RH(D): A POS
ANTIBODY SCREEN: NEGATIVE

## 2016-08-01 LAB — OB RESULTS CONSOLE RPR: RPR: NONREACTIVE

## 2016-08-01 LAB — OB RESULTS CONSOLE GC/CHLAMYDIA
Chlamydia: NEGATIVE
Gonorrhea: NEGATIVE

## 2016-08-01 LAB — PROTEIN / CREATININE RATIO, URINE
CREATININE, URINE: 134 mg/dL
PROTEIN CREATININE RATIO: 0.05 mg/mg{creat} (ref 0.00–0.15)
Total Protein, Urine: 7 mg/dL

## 2016-08-01 LAB — OB RESULTS CONSOLE RUBELLA ANTIBODY, IGM: Rubella: IMMUNE

## 2016-08-01 LAB — OB RESULTS CONSOLE ABO/RH: RH TYPE: POSITIVE

## 2016-08-01 LAB — OB RESULTS CONSOLE HEPATITIS B SURFACE ANTIGEN: Hepatitis B Surface Ag: NEGATIVE

## 2016-08-01 LAB — OB RESULTS CONSOLE GBS: STREP GROUP B AG: NEGATIVE

## 2016-08-01 LAB — OB RESULTS CONSOLE ANTIBODY SCREEN: Antibody Screen: NEGATIVE

## 2016-08-01 MED ORDER — ACETAMINOPHEN 325 MG PO TABS
650.0000 mg | ORAL_TABLET | ORAL | Status: DC | PRN
  Administered 2016-08-03: 650 mg via ORAL
  Filled 2016-08-01: qty 2

## 2016-08-01 MED ORDER — OXYTOCIN 40 UNITS IN LACTATED RINGERS INFUSION - SIMPLE MED: 2.5000 [IU]/h | INTRAVENOUS | Status: DC

## 2016-08-01 MED ORDER — BUTORPHANOL TARTRATE 1 MG/ML IJ SOLN
1.0000 mg | INTRAMUSCULAR | Status: DC | PRN
  Administered 2016-08-02 (×3): 1 mg via INTRAVENOUS
  Filled 2016-08-01 (×4): qty 1

## 2016-08-01 MED ORDER — TERBUTALINE SULFATE 1 MG/ML IJ SOLN
0.2500 mg | Freq: Once | INTRAMUSCULAR | Status: DC | PRN
  Filled 2016-08-01: qty 1

## 2016-08-01 MED ORDER — LIDOCAINE HCL (PF) 1 % IJ SOLN
30.0000 mL | INTRAMUSCULAR | Status: DC | PRN
  Administered 2016-08-03: 30 mL via SUBCUTANEOUS
  Filled 2016-08-01: qty 30

## 2016-08-01 MED ORDER — OXYCODONE-ACETAMINOPHEN 5-325 MG PO TABS
1.0000 | ORAL_TABLET | ORAL | Status: DC | PRN
  Administered 2016-08-04: 1 via ORAL
  Filled 2016-08-01: qty 1

## 2016-08-01 MED ORDER — HYDRALAZINE HCL 20 MG/ML IJ SOLN: 10.0000 mg | Freq: Once | INTRAMUSCULAR | Status: DC | PRN

## 2016-08-01 MED ORDER — LABETALOL HCL 5 MG/ML IV SOLN: 20.0000 mg | INTRAVENOUS | Status: DC | PRN

## 2016-08-01 MED ORDER — ONDANSETRON HCL 4 MG/2ML IJ SOLN
4.0000 mg | Freq: Four times a day (QID) | INTRAMUSCULAR | Status: DC | PRN
  Administered 2016-08-01 – 2016-08-03 (×3): 4 mg via INTRAVENOUS
  Filled 2016-08-01 (×3): qty 2

## 2016-08-01 MED ORDER — SOD CITRATE-CITRIC ACID 500-334 MG/5ML PO SOLN: 30.0000 mL | ORAL | Status: DC | PRN

## 2016-08-01 MED ORDER — LACTATED RINGERS IV SOLN
INTRAVENOUS | Status: DC
  Administered 2016-08-02 (×3): via INTRAVENOUS

## 2016-08-01 MED ORDER — MISOPROSTOL 25 MCG QUARTER TABLET
25.0000 ug | ORAL_TABLET | ORAL | Status: DC | PRN
  Administered 2016-08-01 – 2016-08-02 (×3): 25 ug via VAGINAL
  Filled 2016-08-01: qty 1
  Filled 2016-08-01 (×4): qty 0.25

## 2016-08-01 MED ORDER — LACTATED RINGERS IV SOLN
500.0000 mL | INTRAVENOUS | Status: DC | PRN
  Administered 2016-08-02 (×2): 500 mL via INTRAVENOUS

## 2016-08-01 MED ORDER — OXYTOCIN BOLUS FROM INFUSION
500.0000 mL | Freq: Once | INTRAVENOUS | Status: AC
  Administered 2016-08-03: 500 mL via INTRAVENOUS

## 2016-08-01 MED ORDER — OXYCODONE-ACETAMINOPHEN 5-325 MG PO TABS: 2.0000 | ORAL_TABLET | ORAL | Status: DC | PRN

## 2016-08-01 NOTE — H&P (Signed)
[name] is a 30 y.o. female, G1P0, EGA 38+ weeks with EDC 12-3 presenting for cervical ripening and induction for PIH.  BP over the last week 140/90/100, new proteinuria on 11-20, no symptoms, normal labs last week.  Prenatal care otherwise uncomplicated.  OB History    Gravida Para Term Preterm AB Living   1 0 0 0 0 0   SAB TAB Ectopic Multiple Live Births   0 0 0 0 0     Past Medical History:  Diagnosis Date  . Anxiety   . Chlamydia   . Gonorrhea   . Hypertension   . Seizures (HCC)    Past Surgical History:  Procedure Laterality Date  . CHOLECYSTECTOMY  2015  . COLPOSCOPY     Family History: family history is not on file. Social History:  reports that she has never smoked. She has never used smokeless tobacco. She reports that she does not drink alcohol or use drugs.     Maternal Diabetes: No Genetic Screening: Normal Maternal Ultrasounds/Referrals: Normal Fetal Ultrasounds or other Referrals:  None Maternal Substance Abuse:  No Significant Maternal Medications:  None Significant Maternal Lab Results:  Lab values include: Group B Strep negative Other Comments:  induction for PIH  Review of Systems  Respiratory: Negative.   Cardiovascular: Negative.    Maternal Medical History:  Fetal activity: Perceived fetal activity is normal.      Dilation: Closed Effacement (%): Thick Station: Ballotable Exam by:: AThersa Salt. Schwarz RN  Blood pressure 135/88, pulse 88, temperature 98.2 F (36.8 C), temperature source Oral, resp. rate 16, height 5\' 8"  (1.727 m), weight 118.8 kg (262 lb). Maternal Exam:  Uterine Assessment: Contraction strength is mild.  Contraction frequency is rare.   Abdomen: Patient reports no abdominal tenderness. Estimated fetal weight is 8-9 lbs.   Fetal presentation: vertex  Introitus: Normal vulva. Normal vagina.  Amniotic fluid character: not assessed.  Pelvis: adequate for delivery.   Cervix: Cervix evaluated by digital exam.     Fetal  Exam Fetal Monitor Review: Mode: ultrasound.   Baseline rate: 120.  Variability: moderate (6-25 bpm).   Pattern: accelerations present and no decelerations.    Fetal State Assessment: Category I - tracings are normal.     Physical Exam  Vitals reviewed. Constitutional: She appears well-developed and well-nourished.  Cardiovascular: Normal rate and regular rhythm.   Respiratory: Effort normal. No respiratory distress.  GI: Soft.    Prenatal labs: ABO, Rh: A/Positive/-- (11/21 1857) Antibody: Negative (11/21 1857) Rubella: Immune (11/21 1857) RPR: Nonreactive (11/21 1857)  HBsAg: Negative (11/21 1857)  HIV: Non-reactive (11/21 1857)  GBS: Negative (11/21 1857)   Assessment/Plan: IUP at 38+ weeks with PIH, unfavorable cervix.  Will admit for cervical ripening, monitor BP and treat if needed, check PIH labs.  Will evaluate in the am for response to cytotec.   Long Brimage D 08/01/2016, 9:09 PM

## 2016-08-01 NOTE — Anesthesia Pain Management Evaluation Note (Signed)
  CRNA Pain Management Visit Note  Patient: [name], 30 y.o., female  "Hello I am a member of the anesthesia team at [facility]. We have an anesthesia team available at all times to provide care throughout the hospital, including epidural management and anesthesia for C-section. I don't know your plan for the delivery whether it a natural birth, water birth, IV sedation, nitrous supplementation, doula or epidural, but we want to meet your pain goals."   1.Was your pain managed to your expectations on prior hospitalizations?   No prior hospitalizations  2.What is your expectation for pain management during this hospitalization?     Epidural  3.How can we help you reach that goal? Epidural once I am in labor and ask for it.  Record the patient's initial score and the patient's pain goal.   Pain: 0  Pain Goal: 5 [facility] wants you to be able to say your pain was always managed very well.  Ajax Schroll 08/01/2016

## 2016-08-02 ENCOUNTER — Inpatient Hospital Stay (HOSPITAL_COMMUNITY): Payer: Medicaid Other | Admitting: Anesthesiology

## 2016-08-02 LAB — CBC
HEMATOCRIT: 34.2 % — AB (ref 36.0–46.0)
HEMOGLOBIN: 12 g/dL (ref 12.0–15.0)
MCH: 32.7 pg (ref 26.0–34.0)
MCHC: 35.1 g/dL (ref 30.0–36.0)
MCV: 93.2 fL (ref 78.0–100.0)
Platelets: 211 10*3/uL (ref 150–400)
RBC: 3.67 MIL/uL — ABNORMAL LOW (ref 3.87–5.11)
RDW: 13.8 % (ref 11.5–15.5)
WBC: 8 10*3/uL (ref 4.0–10.5)

## 2016-08-02 LAB — ABO/RH: ABO/RH(D): A POS

## 2016-08-02 LAB — RPR: RPR: NONREACTIVE

## 2016-08-02 MED ORDER — EPHEDRINE 5 MG/ML INJ
10.0000 mg | INTRAVENOUS | Status: DC | PRN
  Filled 2016-08-02: qty 4

## 2016-08-02 MED ORDER — FENTANYL 2.5 MCG/ML BUPIVACAINE 1/10 % EPIDURAL INFUSION (WH - ANES)
14.0000 mL/h | INTRAMUSCULAR | Status: DC | PRN
  Administered 2016-08-02 – 2016-08-03 (×5): 14 mL/h via EPIDURAL
  Filled 2016-08-02 (×5): qty 100

## 2016-08-02 MED ORDER — PHENYLEPHRINE 40 MCG/ML (10ML) SYRINGE FOR IV PUSH (FOR BLOOD PRESSURE SUPPORT)
80.0000 ug | PREFILLED_SYRINGE | INTRAVENOUS | Status: DC | PRN
  Filled 2016-08-02: qty 5
  Filled 2016-08-02: qty 10

## 2016-08-02 MED ORDER — LACTATED RINGERS IV SOLN: 500.0000 mL | Freq: Once | INTRAVENOUS | Status: DC

## 2016-08-02 MED ORDER — DIPHENHYDRAMINE HCL 50 MG/ML IJ SOLN: 12.5000 mg | INTRAMUSCULAR | Status: DC | PRN

## 2016-08-02 MED ORDER — OXYTOCIN 40 UNITS IN LACTATED RINGERS INFUSION - SIMPLE MED
1.0000 m[IU]/min | INTRAVENOUS | Status: DC
Start: 2016-08-02 — End: 2016-08-04
  Administered 2016-08-02: 1 m[IU]/min via INTRAVENOUS
  Filled 2016-08-02: qty 1000

## 2016-08-02 MED ORDER — LIDOCAINE HCL (PF) 1 % IJ SOLN
INTRAMUSCULAR | Status: DC | PRN
  Administered 2016-08-02 (×2): 5 mL via EPIDURAL

## 2016-08-02 NOTE — Progress Notes (Signed)
I am aware of consult for creating an Advance Directive.  RN said she would notify me if pt wants to create that before delivery.  We will otherwise plan to follow up postpartum.    Chaplain Dyanne CarrelKaty Kamren Heintzelman, Bcc Pager, [phone] 3:35 PM    08/02/16 1500  Clinical Encounter Type  Visited With Health care provider

## 2016-08-02 NOTE — Progress Notes (Signed)
Comfortable with epidural Afeb, VSS, BP 130-140/70-80 FHT- Cat I Ve per RN about 45 minute ago 2 cm, foley still in place Will continue with low dose pitocin and foley, plan AROM when foley comes out

## 2016-08-02 NOTE — Anesthesia Preprocedure Evaluation (Signed)
Anesthesia Evaluation  Patient identified by MRN, date of birth, ID band Patient awake    Reviewed: Allergy & Precautions, Patient's Chart, lab work & pertinent test results  Airway Mallampati: II  TM Distance: >3 FB Neck ROM: Full    Dental no notable dental hx. (+) Dental Advisory Given   Pulmonary neg pulmonary ROS,    Pulmonary exam normal        Cardiovascular hypertension, negative cardio ROS Normal cardiovascular exam     Neuro/Psych Seizures -, Well Controlled,  Anxiety    GI/Hepatic negative GI ROS, Neg liver ROS,   Endo/Other  Morbid obesity  Renal/GU negative Renal ROS  negative genitourinary   Musculoskeletal negative musculoskeletal ROS (+)   Abdominal   Peds negative pediatric ROS (+)  Hematology negative hematology ROS (+)   Anesthesia Other Findings   Reproductive/Obstetrics negative OB ROS                             Anesthesia Physical Anesthesia Plan  ASA: III  Anesthesia Plan: Epidural   Post-op Pain Management:    Induction:   Airway Management Planned: Natural Airway  Additional Equipment:   Intra-op Plan:   Post-operative Plan:   Informed Consent: I have reviewed the patients History and Physical, chart, labs and discussed the procedure including the risks, benefits and alternatives for the proposed anesthesia with the patient or authorized representative who has indicated his/her understanding and acceptance.     Plan Discussed with: Anesthesiologist  Anesthesia Plan Comments:         Anesthesia Quick Evaluation

## 2016-08-02 NOTE — Anesthesia Procedure Notes (Signed)
Epidural Patient location during procedure: OB Start time: 08/02/2016 3:01 PM End time: 08/02/2016 3:12 PM  Staffing Anesthesiologist: Heather RobertsSINGER, Crystle Carelli Performed: anesthesiologist   Preanesthetic Checklist Completed: patient identified, site marked, pre-op evaluation, timeout performed, IV checked, risks and benefits discussed and monitors and equipment checked  Epidural Patient position: sitting Prep: DuraPrep Patient monitoring: heart rate, cardiac monitor, continuous pulse ox and blood pressure Approach: midline Location: L2-L3 Injection technique: LOR saline  Needle:  Needle type: Tuohy  Needle gauge: 17 G Needle length: 9 cm Needle insertion depth: 8 cm Catheter size: 20 Guage Catheter at skin depth: 13 cm Test dose: negative and Other  Assessment Events: blood not aspirated, injection not painful, no injection resistance and negative IV test  Additional Notes Informed consent obtained prior to proceeding including risk of failure, 1% risk of PDPH, risk of minor discomfort and bruising.  Discussed rare but serious complications including epidural abscess, permanent nerve injury, epidural hematoma.  Discussed alternatives to epidural analgesia and patient desires to proceed.  Timeout performed pre-procedure verifying patient name, procedure, and platelet count.  Patient tolerated procedure well.

## 2016-08-02 NOTE — Progress Notes (Signed)
Comfortable with epidural Afeb, VSS, BP normal since epidural FHT- Cat I VE-4/70/-2, vtx, foley removed after removing 10cc fluid, AROM clear, IUPC placed Continue pitocin, monitor progress, anticipate SVD

## 2016-08-02 NOTE — Progress Notes (Signed)
Still feeling some ctx, using Stadol Afeb, VSS, BP ok curretnly FHT-remains Cat I VE, closed/70/-2, vtx, able to feed foley through cervix and put 60 cc saline in Will start low dose pitocin and put foley to traction

## 2016-08-02 NOTE — Progress Notes (Signed)
Received 2 doses of cytotec overnight, feeling ctx Afeb, VSS, BP 120-140/80-90 FHT- baseline varies from 110-140, mod variability, + accels, no decle, Cat I VE-Cl/60/-3, vtx, unable to get foley catheter into cervix, cytotec dose placed Will continue ripening for PIH, normal labs, reassess in 4 hours for further plan

## 2016-08-02 NOTE — Anesthesia Pain Management Evaluation Note (Signed)
  CRNA Pain Management Visit Note  Patient: [name], 30 y.o., female  "Hello I am a member of the anesthesia team at [facility]. We have an anesthesia team available at all times to provide care throughout the hospital, including epidural management and anesthesia for C-section. I don't know your plan for the delivery whether it a natural birth, water birth, IV sedation, nitrous supplementation, doula or epidural, but we want to meet your pain goals."   1.Was your pain managed to your expectations on prior hospitalizations?   No prior hospitalizations  2.What is your expectation for pain management during this hospitalization?     Epidural and IV pain meds  3.How can we help you reach that goal?get epidural when I need it  Record the patient's initial score and the patient's pain goal.   Pain: 7  Pain Goal: 9 [facility] wants you to be able to say your pain was always managed very well.  Joeanna Howdyshell 08/02/2016

## 2016-08-03 ENCOUNTER — Encounter (HOSPITAL_COMMUNITY): Payer: Self-pay | Admitting: Obstetrics and Gynecology

## 2016-08-03 LAB — CBC
HCT: 32.3 % — ABNORMAL LOW (ref 36.0–46.0)
HEMOGLOBIN: 11.4 g/dL — AB (ref 12.0–15.0)
MCH: 32.7 pg (ref 26.0–34.0)
MCHC: 35.3 g/dL (ref 30.0–36.0)
MCV: 92.6 fL (ref 78.0–100.0)
PLATELETS: 198 10*3/uL (ref 150–400)
RBC: 3.49 MIL/uL — AB (ref 3.87–5.11)
RDW: 13.6 % (ref 11.5–15.5)
WBC: 18.1 10*3/uL — AB (ref 4.0–10.5)

## 2016-08-03 MED ORDER — WITCH HAZEL-GLYCERIN EX PADS: 1.0000 "application " | MEDICATED_PAD | CUTANEOUS | Status: DC | PRN

## 2016-08-03 MED ORDER — DIBUCAINE 1 % RE OINT: 1.0000 "application " | TOPICAL_OINTMENT | RECTAL | Status: DC | PRN

## 2016-08-03 MED ORDER — DIPHENHYDRAMINE HCL 25 MG PO CAPS: 25.0000 mg | ORAL_CAPSULE | Freq: Four times a day (QID) | ORAL | Status: DC | PRN

## 2016-08-03 MED ORDER — DEXTROSE 5 % IV SOLN
500.0000 mg | INTRAVENOUS | Status: DC
  Filled 2016-08-03: qty 500

## 2016-08-03 MED ORDER — ACETAMINOPHEN 325 MG PO TABS
650.0000 mg | ORAL_TABLET | ORAL | Status: DC | PRN
Start: 2016-08-03 — End: 2016-08-05

## 2016-08-03 MED ORDER — IBUPROFEN 600 MG PO TABS
600.0000 mg | ORAL_TABLET | Freq: Four times a day (QID) | ORAL | Status: DC
  Administered 2016-08-03 – 2016-08-04 (×2): 600 mg via ORAL
  Filled 2016-08-03 (×3): qty 1

## 2016-08-03 MED ORDER — ONDANSETRON HCL 4 MG/2ML IJ SOLN: 4.0000 mg | INTRAMUSCULAR | Status: DC | PRN

## 2016-08-03 MED ORDER — TETANUS-DIPHTH-ACELL PERTUSSIS 5-2.5-18.5 LF-MCG/0.5 IM SUSP: 0.5000 mL | Freq: Once | INTRAMUSCULAR | Status: DC

## 2016-08-03 MED ORDER — ONDANSETRON HCL 4 MG PO TABS
4.0000 mg | ORAL_TABLET | ORAL | Status: DC | PRN
Start: 2016-08-03 — End: 2016-08-05

## 2016-08-03 MED ORDER — SENNOSIDES-DOCUSATE SODIUM 8.6-50 MG PO TABS
2.0000 | ORAL_TABLET | ORAL | Status: DC
  Administered 2016-08-04 (×2): 2 via ORAL
  Filled 2016-08-03 (×2): qty 2

## 2016-08-03 MED ORDER — ZOLPIDEM TARTRATE 5 MG PO TABS
5.0000 mg | ORAL_TABLET | Freq: Every evening | ORAL | Status: DC | PRN
Start: 2016-08-03 — End: 2016-08-04

## 2016-08-03 MED ORDER — HEPARIN SODIUM (PORCINE) 5000 UNIT/ML IJ SOLN
5000.0000 [IU] | Freq: Three times a day (TID) | INTRAMUSCULAR | Status: DC
  Filled 2016-08-03: qty 1

## 2016-08-03 MED ORDER — COCONUT OIL OIL: 1.0000 "application " | TOPICAL_OIL | Status: DC | PRN

## 2016-08-03 MED ORDER — BENZOCAINE-MENTHOL 20-0.5 % EX AERO
1.0000 "application " | INHALATION_SPRAY | CUTANEOUS | Status: DC | PRN
  Administered 2016-08-04: 1 via TOPICAL
  Filled 2016-08-03: qty 56

## 2016-08-03 MED ORDER — PRENATAL MULTIVITAMIN CH
1.0000 | ORAL_TABLET | Freq: Every day | ORAL | Status: DC
  Administered 2016-08-04: 1 via ORAL
  Filled 2016-08-03: qty 1

## 2016-08-03 MED ORDER — SODIUM CHLORIDE 0.9 % IV SOLN
2.0000 g | Freq: Four times a day (QID) | INTRAVENOUS | Status: DC
  Administered 2016-08-03: 2 g via INTRAVENOUS
  Filled 2016-08-03 (×2): qty 2000

## 2016-08-03 MED ORDER — SIMETHICONE 80 MG PO CHEW: 80.0000 mg | CHEWABLE_TABLET | ORAL | Status: DC | PRN

## 2016-08-03 NOTE — Progress Notes (Signed)
UR chart review completed.  

## 2016-08-03 NOTE — Progress Notes (Signed)
Patient ID: Kyra MangesKara Elise York, female   DOB: 1986/02/28, 30 y.o.   MRN: [id number] Pt is complete and at +2 station with urge to push C/o tender area on left lower back.  Epidural working well otherwise  Plan: Pushing started about 15mins ago           Anticipate svd

## 2016-08-03 NOTE — Anesthesia Postprocedure Evaluation (Signed)
Anesthesia Post Note  Patient: [name]  Procedure(s) Performed: * No procedures listed *  Patient location during evaluation: Mother Baby Anesthesia Type: Epidural Level of consciousness: awake and alert Pain management: satisfactory to patient Vital Signs Assessment: post-procedure vital signs reviewed and stable Respiratory status: respiratory function stable Cardiovascular status: stable Postop Assessment: no headache, no backache, epidural receding, patient able to bend at knees, no signs of nausea or vomiting and adequate PO intake Anesthetic complications: no     Last Vitals:  Vitals:   08/03/16 2016 08/03/16 2045  BP: 127/76 123/73  Pulse: 84 84  Resp: 16 18  Temp:      Last Pain:  Vitals:   08/03/16 1902  TempSrc:   PainSc: 8    Pain Goal:                 [name]

## 2016-08-03 NOTE — Progress Notes (Addendum)
AOW via wheelchair. FFmidline scant rubra. icepack to area. Pain 5/10. Motrin given  2151: MD notified for clarification of orders. Orders modified.   2300: assisted pt with b. Feeding. Infant not staying latched despite assisting for 30 mins. Hand expression of breast initiated and spoon fed infant 3 times in one sitting. Infant spit up some.   0110: Hand pump and curved syringe provided, setup, and initiated. Pt pumping and will spoon feed infant. Will cont to monitor.   0300: up to bathroom and voided. Pericare given and explained with pt understanding.

## 2016-08-03 NOTE — Progress Notes (Signed)
Patient ID: [name], female   DOB: [birth year], 30 y.o.   MRN: [id number] Pt doing well; hungry otherwise no complaints. +Fms Comfortable with epidural.  VSS; afeb EFM - cat 1; 145 TOCO - adequate mvus at 225; ctxs q 3-334mins SVE - 6.5/90/0  A/P: Continue with labor monitoring         Anticipate svd

## 2016-08-03 NOTE — Progress Notes (Addendum)
Patient ID: [name], female   DOB: [birth year], 30 y.o.   MRN: [id number] Pt now 7-8cm dil per nurse check an hour ago Now 8.5cm dil and +1 station  Pit at 17mus with adequate mvus Cat 1 strip baseline 140  Continue with expectant mgmt; recheck in 2 hour or prn Anticipate svd

## 2016-08-03 NOTE — Progress Notes (Signed)
Comfortable with epidural Afeb, VSS, BP normal FHT- Cat I, ctx q 3-4 min VE6/90/-1, vtx Continue pitocin and monitor progress, hopefully getting into active labor

## 2016-08-04 ENCOUNTER — Encounter (HOSPITAL_COMMUNITY): Payer: Self-pay

## 2016-08-04 LAB — CBC
HCT: 32.8 % — ABNORMAL LOW (ref 36.0–46.0)
HEMOGLOBIN: 11.6 g/dL — AB (ref 12.0–15.0)
MCH: 32.8 pg (ref 26.0–34.0)
MCHC: 35.4 g/dL (ref 30.0–36.0)
MCV: 92.7 fL (ref 78.0–100.0)
PLATELETS: 186 10*3/uL (ref 150–400)
RBC: 3.54 MIL/uL — ABNORMAL LOW (ref 3.87–5.11)
RDW: 13.6 % (ref 11.5–15.5)
WBC: 12.7 10*3/uL — ABNORMAL HIGH (ref 4.0–10.5)

## 2016-08-04 MED ORDER — IBUPROFEN 800 MG PO TABS
800.0000 mg | ORAL_TABLET | Freq: Three times a day (TID) | ORAL | Status: DC | PRN
Start: 2016-08-04 — End: 2016-08-05
  Administered 2016-08-04 – 2016-08-05 (×4): 800 mg via ORAL
  Filled 2016-08-04 (×4): qty 1

## 2016-08-04 MED ORDER — IBUPROFEN 600 MG PO TABS: 600.0000 mg | ORAL_TABLET | Freq: Four times a day (QID) | ORAL | Status: DC | PRN

## 2016-08-04 NOTE — Progress Notes (Signed)
I received a Spiritual Care consult stating that pt wanted to create and Advance Directive. I brought her information and answered initial questions.  She plans to complete it at a later time.   She did share her birth story and her concern about her daughter being very sleepy and spitting up often.  I normalized her concerns and encouraged her to continue to ask questions to the medical team.  Kathleen Arguehaplain Katy Archer Vise, Bcc Pager, [phone] 11:18 AM    08/04/16 1100  Clinical Encounter Type  Visited With Patient  Visit Type Initial;Other (Comment)  Referral From (Advance Care Planning)  Spiritual Encounters  Spiritual Needs Emotional

## 2016-08-04 NOTE — Progress Notes (Addendum)
Post Partum Day 1 Subjective: up ad lib, voiding, tolerating PO, + flatus and lochia mild - moderate as expected. She feels well except from some hip discomfort from pushing.  Bonding with baby and breastfeeding. No complaints. Denies any headaches or blurry vision  Objective: Blood pressure 120/88, pulse 79, temperature 98.2 F (36.8 C), temperature source Oral, resp. rate 18, height 5\' 8"  (1.727 m), weight 262 lb (118.8 kg), SpO2 98 %, unknown if currently breastfeeding.  Physical Exam:  General: alert, cooperative and no distress Lochia: appropriate Uterine Fundus: firm Incision: n/a DVT Evaluation: No evidence of DVT seen on physical exam. No significant calf/ankle edema.   Recent Labs  08/02/16 1413 08/03/16 2036  HGB 12.0 11.4*  HCT 34.2* 32.3*    Assessment/Plan: Plan for discharge tomorrow, Breastfeeding and Lactation consult  BP stable   LOS: 3 days   [facility] Hospital[facility]ecilia Worema Mychael York 08/04/2016, 5:48 AM

## 2016-08-04 NOTE — Progress Notes (Signed)
MOB was referred for history of depression/anxiety. * Referral screened out by Clinical Social Worker because none of the following criteria appear to apply: ~ History of anxiety/depression during this pregnancy, or of post-partum depression. ~ Diagnosis of anxiety and/or depression within last 3 years OR * MOB's symptoms currently being treated with medication and/or therapy. Please contact the Clinical Social Worker if needs arise, or if MOB requests.  Delane Wessinger Boyd-Gilyard, MSW, LCSW Clinical Social Work [phone] 

## 2016-08-04 NOTE — Progress Notes (Signed)
Provided pt with the information and answered initial questions.  She plans to complete at a later point.  Chaplain Dyanne CarrelKaty Anthonyjames Bargar, Bcc Pager, [phone] 11:20 AM

## 2016-08-04 NOTE — Lactation Note (Signed)
This note was copied from a baby's chart. Lactation Consultation Note  Patient Name: Girl Denton MeekKara Gwin Today's Date: 08/04/2016 Reason for consult: Initial assessment  With this first time mom and term baby, now 8016 hours old. The baby has been spoon fed good amounts, but has not latched to breast in "7 hours " as per parents. The baby has had more than adequated wet and dirty diapers. Baby was very spitty since birth. On exam of baby today, she is sleepy, with head bruising from a difficult birth. I assisted mom with cross cradle hold. The baby opened wide once colostrum was hand expressed, baby latched deeply, few suckles, and asleep. I explained that the baby has had a lot to eat, and is now tired from delivery, and that skin to skin, and resting for both mom and baby would be fine for now. Mom to try and sllep, while dad watches to be sure baby is safe. Mom to call when baby next shows feeding cues. Lactation and Baby and Me book brestfeeding information reviewed with parents. Feeding diary started and parents show how to keep this up to date.   Maternal Data Formula Feeding for Exclusion: No Has patient been taught Hand Expression?: Yes Does the patient have breastfeeding experience prior to this delivery?: No  Feeding Feeding Type: Breast Fed  LATCH Score/Interventions Latch: Too sleepy or reluctant, no latch achieved, no sucking elicited. Intervention(s): Assist with latch;Breast compression  Audible Swallowing: None Intervention(s): Skin to skin;Hand expression  Type of Nipple: Everted at rest and after stimulation (very soft, flat breasts a this time)  Comfort (Breast/Nipple): Soft / non-tender     Hold (Positioning): Assistance needed to correctly position infant at breast and maintain latch. Intervention(s): Breastfeeding basics reviewed;Support Pillows;Position options;Skin to skin  LATCH Score: 5  Lactation Tools Discussed/Used     Consult Status Consult Status:  Follow-up Date: 08/04/16 Follow-up type: In-patient    Alfred LevinsLee, Shloime Keilman Anne 08/04/2016, 11:49 AM

## 2016-08-05 MED ORDER — IBUPROFEN 600 MG PO TABS: 600.0000 mg | ORAL_TABLET | Freq: Four times a day (QID) | ORAL | 1 refills | Status: DC | PRN

## 2016-08-05 NOTE — Progress Notes (Signed)
Patient ID: [name], female   DOB: [birth year], 30 y.o.   MRN: [id number] Pt doing well. Pain well controlled with ibuprofen and  heating pad. No fever or chills. Denies headaches or blurry vision. Breastfeeding well and bonding with baby. Ready for discharge to home today.   VSS - 114-138/82-94  ABD- soft, ND EXT - no homans  A/P: PPD#2 s/p svd - stable         BP controlled         Discharge instructions reviewed         Considering mini pill for bc

## 2016-08-05 NOTE — Lactation Note (Addendum)
This note was copied from a baby's chart. Lactation Consultation Note Mom hoping to be d/c home today, pending bili serum 1100. Mom states after the 24 hr of age, baby has been feeding well. C/o latch sore at first latch, then is fine. Position options discussed as well as props and comfort. Encouraged I&O after d/c home. Mom will work from home, plans to BF exclusively, has DEBP. Wants to BF w/occassionaly bottle BM so FOB can help out as well. Discussed building milk supply and storage for back up. Educated on engorgement, management, prevention, and clogged ducts. Reviewed output for baby and newborn feeding habits. Encouraged to cont. STS and assessing breast before and after feedings. Discussed jaundice and BF.  Moms breast are soft, noted good breast movement when baby suckling. Mom has "V" shaped breast w/everted nipple at the end of breast slightly inward. Demonstrated rolled cloth to elevate breast to support and lift upwards during feeding so not to pull on nipples. Baby had good cheek to breast while feeding. Heard occasional swallows. Discussed stimulation d/t sleepiness, jaundice causing to be sleepy, and the importance of feeding along w/output to excrete bili.  Reminded of support groups and OP LC available if needed.  Patient Name: [name]: 08/05/2016 Reason for consult: Follow-up assessment   Maternal Data    Feeding Feeding Type: Breast Fed Length of feed: 5 min  LATCH Score/Interventions Latch: Grasps breast easily, tongue down, lips flanged, rhythmical sucking. Intervention(s): Skin to skin;Teach feeding cues;Waking techniques Intervention(s): Adjust position;Assist with latch;Breast massage;Breast compression  Audible Swallowing: A few with stimulation Intervention(s): Hand expression;Skin to skin Intervention(s): Alternate breast massage  Type of Nipple: Everted at rest and after stimulation Intervention(s): Hand pump;Shells  Comfort  (Breast/Nipple): Soft / non-tender     Hold (Positioning): Assistance needed to correctly position infant at breast and maintain latch. Intervention(s): Breastfeeding basics reviewed;Support Pillows;Position options;Skin to skin  LATCH Score: 8  Lactation Tools Discussed/Used Tools: Shells;Pump Shell Type: Inverted Breast pump type: Manual WIC Program: No Pump Review: Setup, frequency, and cleaning;Milk Storage Initiated by:: Peri JeffersonL. Martha Silbaugh RN IBCLC York initiated:: 08/05/16   Consult Status Consult Status: Complete York: 08/05/16    [name], [name] 08/05/2016, 9:35 AM

## 2016-08-05 NOTE — Discharge Instructions (Signed)
Nothing in vagina for 6 weeks.  No sex, tampons, and douching.  Other instructions as in [facility] Discharge Booklet. °

## 2016-08-05 NOTE — Progress Notes (Signed)
Discharge education complete. Discharge instructions and follow up appointments discussed. Patient verbalized understanding.

## 2016-08-05 NOTE — Discharge Summary (Signed)
OB Discharge Summary     Patient Name: [name] DOB: Jul 20, 1986 MRN: [id number]  Date of admission: 08/01/2016 Delivering MD: Pryor OchoaBANGA, Hur[facility]   Date of discharge: 08/05/2016  Admitting diagnosis: INDUCTION Intrauterine pregnancy: 2168w4d     Secondary diagnosis:  Active Problems:   PIH (pregnancy induced hypertension), third trimester   SVD (spontaneous vaginal delivery)   Postpartum care following vaginal delivery  Additional problems: none     Discharge diagnosis: Gestational Hypertension                                                                                                Post partum procedures:none  Augmentation: AROM, Pitocin and Cytotec  Complications: ROM>24 hours  Hospital course:  Induction of Labor With Vaginal Delivery   30 y.o. yo G1P1000 at 7468w4d was admitted to the hospital 08/01/2016 for induction of labor.  Indication for induction: Gestational hypertension.  Patient had an uncomplicated labor course as follows: Membrane Rupture Time/Date: 9:28 PM ,08/02/2016   Intrapartum Procedures: Episiotomy: None [1]                                         Lacerations:  2nd degree [3]  Patient had delivery of a Viable infant.  Information for the patient's newborn:  [name], Girl [name] [[id number]][[id number]]  Delivery Method: Vag-Spont   08/03/2016  Details of delivery can be found in separate delivery note.  Patient had a routine postpartum course. Patient is discharged home 08/05/16.   Physical exam Vitals:   08/04/16 0236 08/04/16 0545 08/04/16 1755 08/05/16 0543  BP: 120/88 120/77 (!) 138/94   Pulse: 79 73 84 65  Resp: 18 16 18 18   Temp: 98.2 F (36.8 C) 97.9 F (36.6 C) 98.2 F (36.8 C) 98.1 F (36.7 C)  TempSrc: Oral Oral Oral Oral  SpO2:      Weight:      Height:       General: alert, cooperative and no distress Lochia: appropriate Uterine Fundus: firm Incision: N/A DVT Evaluation: No evidence of DVT seen on physical exam. Labs: Lab  Results  Component Value Date   WBC 12.7 (H) 08/04/2016   HGB 11.6 (L) 08/04/2016   HCT 32.8 (L) 08/04/2016   MCV 92.7 08/04/2016   PLT 186 08/04/2016   CMP Latest Ref Rng & Units 08/01/2016  Glucose 65 - 99 mg/dL 98  BUN 6 - 20 mg/dL 8  Creatinine 1.320.44 - 4.401.00 mg/dL 1.020.60  Sodium 725135 - 366145 mmol/L 135  Potassium 3.5 - 5.1 mmol/L 3.7  Chloride 101 - 111 mmol/L 105  CO2 22 - 32 mmol/L 21(L)  Calcium 8.9 - 10.3 mg/dL 9.3  Total Protein 6.5 - 8.1 g/dL 6.2(L)  Total Bilirubin 0.3 - 1.2 mg/dL 0.6  Alkaline Phos 38 - 126 U/L 121  AST 15 - 41 U/L 19  ALT 14 - 54 U/L 12(L)    Discharge instruction: per After Visit Summary and "Baby and Me Booklet".  After visit meds:  Medication List    TAKE these medications   EPINEPHrine 0.3 mg/0.3 mL Soaj injection Commonly known as:  EPI-PEN Inject 0.3 mg into the muscle once.   ibuprofen 600 MG tablet Commonly known as:  ADVIL,MOTRIN Take 1 tablet (600 mg total) by mouth every 6 (six) hours as needed.   prenatal multivitamin Tabs tablet Take 1 tablet by mouth daily at 12 noon.   ranitidine 150 MG tablet Commonly known as:  ZANTAC Take 150 mg by mouth daily.       Diet: low salt diet  Activity: Advance as tolerated. Pelvic rest for 6 weeks.   Outpatient follow up:1 week and 6 weeks Follow up Appt:No future appointments. Follow up Visit:No Follow-up on file.  Postpartum contraception: Progesterone only pills and Undecided  Newborn Data: Live born female  Birth Weight: 7 lb 6.7 oz (3365 g) APGAR: 8, 9  Baby Feeding: Breast Disposition:home with mother   08/05/2016 Martha Areolaecilia Worema Diedre Maclellan, DO

## 2017-02-13 ENCOUNTER — Emergency Department: Payer: Medicaid Other | Admitting: Anesthesiology

## 2017-02-13 ENCOUNTER — Observation Stay
Admission: EM | Admit: 2017-02-13 | Discharge: 2017-02-14 | Disposition: A | Discharge status: Home / Self Care | Payer: Medicaid Other | Attending: Obstetrics and Gynecology | Admitting: Obstetrics and Gynecology

## 2017-02-13 ENCOUNTER — Encounter
Admission: EM | Disposition: A | Discharge status: Home / Self Care | Payer: Self-pay | Source: Home / Self Care | Attending: Emergency Medicine

## 2017-02-13 ENCOUNTER — Emergency Department: Payer: Medicaid Other

## 2017-02-13 DIAGNOSIS — O00101 Right tubal pregnancy without intrauterine pregnancy: Principal | ICD-10-CM | POA: Insufficient documentation

## 2017-02-13 DIAGNOSIS — O00109 Unspecified tubal pregnancy without intrauterine pregnancy: Secondary | ICD-10-CM | POA: Diagnosis present

## 2017-02-13 DIAGNOSIS — I959 Hypotension, unspecified: Secondary | ICD-10-CM

## 2017-02-13 DIAGNOSIS — R55 Syncope and collapse: Secondary | ICD-10-CM

## 2017-02-13 DIAGNOSIS — K661 Hemoperitoneum: Secondary | ICD-10-CM | POA: Diagnosis not present

## 2017-02-13 DIAGNOSIS — R1030 Lower abdominal pain, unspecified: Secondary | ICD-10-CM | POA: Diagnosis present

## 2017-02-13 HISTORY — PX: DIAGNOSTIC LAPAROSCOPY WITH REMOVAL OF ECTOPIC PREGNANCY: SHX6449

## 2017-02-13 LAB — URINALYSIS, COMPLETE (UACMP) WITH MICROSCOPIC
BACTERIA UA: NONE SEEN
Bilirubin Urine: NEGATIVE
GLUCOSE, UA: NEGATIVE mg/dL
HGB URINE DIPSTICK: NEGATIVE
Ketones, ur: NEGATIVE mg/dL
LEUKOCYTES UA: NEGATIVE
NITRITE: NEGATIVE
PROTEIN: 30 mg/dL — AB
Specific Gravity, Urine: 1.016 (ref 1.005–1.030)
pH: 5 (ref 5.0–8.0)

## 2017-02-13 LAB — CBC WITH DIFFERENTIAL/PLATELET
BASOS ABS: 0.1 10*3/uL (ref 0–0.1)
BASOS PCT: 1 %
EOS ABS: 0.1 10*3/uL (ref 0–0.7)
EOS PCT: 1 %
HEMATOCRIT: 37 % (ref 35.0–47.0)
Hemoglobin: 12.7 g/dL (ref 12.0–16.0)
Lymphocytes Relative: 27 %
Lymphs Abs: 3.4 10*3/uL (ref 1.0–3.6)
MCH: 30.4 pg (ref 26.0–34.0)
MCHC: 34.3 g/dL (ref 32.0–36.0)
MCV: 88.6 fL (ref 80.0–100.0)
MONO ABS: 0.7 10*3/uL (ref 0.2–0.9)
MONOS PCT: 6 %
NEUTROS ABS: 8.3 10*3/uL — AB (ref 1.4–6.5)
Neutrophils Relative %: 65 %
PLATELETS: 342 10*3/uL (ref 150–440)
RBC: 4.17 MIL/uL (ref 3.80–5.20)
RDW: 12.9 % (ref 11.5–14.5)
WBC: 12.5 10*3/uL — ABNORMAL HIGH (ref 3.6–11.0)

## 2017-02-13 LAB — COMPREHENSIVE METABOLIC PANEL
ALBUMIN: 4.1 g/dL (ref 3.5–5.0)
ALT: 19 U/L (ref 14–54)
ANION GAP: 8 (ref 5–15)
AST: 25 U/L (ref 15–41)
Alkaline Phosphatase: 61 U/L (ref 38–126)
BILIRUBIN TOTAL: 0.9 mg/dL (ref 0.3–1.2)
BUN: 13 mg/dL (ref 6–20)
CHLORIDE: 107 mmol/L (ref 101–111)
CO2: 22 mmol/L (ref 22–32)
Calcium: 9.6 mg/dL (ref 8.9–10.3)
Creatinine, Ser: 0.78 mg/dL (ref 0.44–1.00)
GFR calc Af Amer: >60 mL/min (ref >60)
GFR calc non Af Amer: >60 mL/min (ref >60)
GLUCOSE: 149 mg/dL — AB (ref 65–99)
POTASSIUM: 3.1 mmol/L — AB (ref 3.5–5.1)
SODIUM: 137 mmol/L (ref 135–145)
TOTAL PROTEIN: 7 g/dL (ref 6.5–8.1)

## 2017-02-13 LAB — CBC
HCT: 29.4 % — ABNORMAL LOW (ref 35.0–47.0)
Hemoglobin: 10.1 g/dL — ABNORMAL LOW (ref 12.0–16.0)
MCH: 31.4 pg (ref 26.0–34.0)
MCHC: 34.5 g/dL (ref 32.0–36.0)
MCV: 91 fL (ref 80.0–100.0)
PLATELETS: 155 10*3/uL (ref 150–440)
RBC: 3.23 MIL/uL — AB (ref 3.80–5.20)
RDW: 12.7 % (ref 11.5–14.5)
WBC: 7.9 10*3/uL (ref 3.6–11.0)

## 2017-02-13 LAB — TROPONIN I: Troponin I: <0.03 ng/mL (ref <0.03)

## 2017-02-13 LAB — LIPASE, BLOOD: Lipase: 27 U/L (ref 11–51)

## 2017-02-13 LAB — PROTIME-INR
INR: 1.15
Prothrombin Time: 14.8 seconds (ref 11.4–15.2)

## 2017-02-13 LAB — URINE DRUG SCREEN, QUALITATIVE (ARMC ONLY)
AMPHETAMINES, UR SCREEN: NOT DETECTED
Barbiturates, Ur Screen: NOT DETECTED
Benzodiazepine, Ur Scrn: NOT DETECTED
COCAINE METABOLITE, UR ~~LOC~~: NOT DETECTED
Cannabinoid 50 Ng, Ur ~~LOC~~: NOT DETECTED
MDMA (ECSTASY) UR SCREEN: NOT DETECTED
Methadone Scn, Ur: NOT DETECTED
Opiate, Ur Screen: NOT DETECTED
PHENCYCLIDINE (PCP) UR S: NOT DETECTED
TRICYCLIC, UR SCREEN: NOT DETECTED

## 2017-02-13 LAB — ETHANOL: Alcohol, Ethyl (B): <5 mg/dL (ref <5)

## 2017-02-13 LAB — TYPE AND SCREEN
ABO/RH(D): A POS
Antibody Screen: NEGATIVE

## 2017-02-13 LAB — POCT PREGNANCY, URINE: PREG TEST UR: POSITIVE — AB

## 2017-02-13 LAB — AMMONIA

## 2017-02-13 LAB — HCG, QUANTITATIVE, PREGNANCY: HCG, BETA CHAIN, QUANT, S: 48392 m[IU]/mL — AB (ref <5)

## 2017-02-13 SURGERY — LAPAROSCOPY, WITH ECTOPIC PREGNANCY SURGICAL TREATMENT
Anesthesia: General | Site: Abdomen | Laterality: Right | Wound class: Clean Contaminated

## 2017-02-13 MED ORDER — PROPOFOL 10 MG/ML IV BOLUS
INTRAVENOUS | Status: DC | PRN
Start: 2017-02-13 — End: 2017-02-13
  Administered 2017-02-13: 160 mg via INTRAVENOUS

## 2017-02-13 MED ORDER — ACETAMINOPHEN 10 MG/ML IV SOLN
INTRAVENOUS | Status: AC
  Filled 2017-02-13: qty 100

## 2017-02-13 MED ORDER — FENTANYL CITRATE (PF) 100 MCG/2ML IJ SOLN
INTRAMUSCULAR | Status: AC
  Filled 2017-02-13: qty 2

## 2017-02-13 MED ORDER — SODIUM CHLORIDE 0.9 % IV BOLUS (SEPSIS)
1000.0000 mL | Freq: Once | INTRAVENOUS | Status: AC
  Administered 2017-02-13: 1000 mL via INTRAVENOUS

## 2017-02-13 MED ORDER — LACTATED RINGERS IV SOLN: INTRAVENOUS | Status: DC

## 2017-02-13 MED ORDER — OXYCODONE-ACETAMINOPHEN 5-325 MG PO TABS
1.0000 | ORAL_TABLET | ORAL | Status: DC | PRN
  Administered 2017-02-13 – 2017-02-14 (×7): 2 via ORAL
  Filled 2017-02-13 (×7): qty 2

## 2017-02-13 MED ORDER — BUPIVACAINE HCL 0.5 % IJ SOLN
INTRAMUSCULAR | Status: DC | PRN
  Administered 2017-02-13: 15 mL

## 2017-02-13 MED ORDER — ROCURONIUM BROMIDE 50 MG/5ML IV SOLN
INTRAVENOUS | Status: AC
  Filled 2017-02-13: qty 1

## 2017-02-13 MED ORDER — SUGAMMADEX SODIUM 200 MG/2ML IV SOLN
INTRAVENOUS | Status: DC | PRN
  Administered 2017-02-13: 200 mg via INTRAVENOUS

## 2017-02-13 MED ORDER — LIDOCAINE HCL (CARDIAC) 20 MG/ML IV SOLN
INTRAVENOUS | Status: DC | PRN
  Administered 2017-02-13: 60 mg via INTRAVENOUS

## 2017-02-13 MED ORDER — ONDANSETRON 4 MG PO TBDP: 4.0000 mg | ORAL_TABLET | Freq: Four times a day (QID) | ORAL | Status: DC | PRN

## 2017-02-13 MED ORDER — DEXTROSE IN LACTATED RINGERS 5 % IV SOLN
INTRAVENOUS | Status: DC
Start: 2017-02-13 — End: 2017-02-14
  Administered 2017-02-13 (×2): via INTRAVENOUS

## 2017-02-13 MED ORDER — ACETAMINOPHEN 10 MG/ML IV SOLN
INTRAVENOUS | Status: DC | PRN
  Administered 2017-02-13: 1000 mg via INTRAVENOUS

## 2017-02-13 MED ORDER — ONDANSETRON HCL 4 MG/2ML IJ SOLN
INTRAMUSCULAR | Status: DC | PRN
  Administered 2017-02-13: 4 mg via INTRAVENOUS

## 2017-02-13 MED ORDER — MIDAZOLAM HCL 2 MG/2ML IJ SOLN
INTRAMUSCULAR | Status: AC
  Filled 2017-02-13: qty 2

## 2017-02-13 MED ORDER — LACTATED RINGERS IV SOLN
INTRAVENOUS | Status: DC | PRN
  Administered 2017-02-13 (×2): via INTRAVENOUS

## 2017-02-13 MED ORDER — SUCCINYLCHOLINE CHLORIDE 20 MG/ML IJ SOLN
INTRAMUSCULAR | Status: AC
  Filled 2017-02-13: qty 1

## 2017-02-13 MED ORDER — FENTANYL CITRATE (PF) 100 MCG/2ML IJ SOLN
INTRAMUSCULAR | Status: DC | PRN
  Administered 2017-02-13: 50 ug via INTRAVENOUS
  Administered 2017-02-13: 100 ug via INTRAVENOUS
  Administered 2017-02-13 (×3): 50 ug via INTRAVENOUS

## 2017-02-13 MED ORDER — DEXAMETHASONE SODIUM PHOSPHATE 10 MG/ML IJ SOLN
INTRAMUSCULAR | Status: DC | PRN
  Administered 2017-02-13: 10 mg via INTRAVENOUS

## 2017-02-13 MED ORDER — FENTANYL CITRATE (PF) 100 MCG/2ML IJ SOLN
25.0000 ug | Freq: Once | INTRAMUSCULAR | Status: AC
  Administered 2017-02-13: 25 ug via INTRAVENOUS

## 2017-02-13 MED ORDER — KETOROLAC TROMETHAMINE 30 MG/ML IJ SOLN
30.0000 mg | Freq: Once | INTRAMUSCULAR | Status: AC
  Administered 2017-02-13: 30 mg via INTRAVENOUS
  Filled 2017-02-13: qty 1

## 2017-02-13 MED ORDER — ONDANSETRON HCL 4 MG/2ML IJ SOLN: 4.0000 mg | Freq: Once | INTRAMUSCULAR | Status: DC | PRN

## 2017-02-13 MED ORDER — ONDANSETRON HCL 4 MG/2ML IJ SOLN
4.0000 mg | Freq: Once | INTRAMUSCULAR | Status: AC
  Administered 2017-02-13: 4 mg via INTRAVENOUS

## 2017-02-13 MED ORDER — IBUPROFEN 600 MG PO TABS
600.0000 mg | ORAL_TABLET | Freq: Four times a day (QID) | ORAL | Status: DC | PRN
  Administered 2017-02-14: 600 mg via ORAL
  Filled 2017-02-13: qty 1

## 2017-02-13 MED ORDER — MIDAZOLAM HCL 2 MG/2ML IJ SOLN
INTRAMUSCULAR | Status: DC | PRN
  Administered 2017-02-13 (×2): 2 mg via INTRAVENOUS

## 2017-02-13 MED ORDER — PROPOFOL 10 MG/ML IV BOLUS
INTRAVENOUS | Status: AC
  Filled 2017-02-13: qty 20

## 2017-02-13 MED ORDER — FENTANYL CITRATE (PF) 100 MCG/2ML IJ SOLN
25.0000 ug | INTRAMUSCULAR | Status: DC | PRN
  Administered 2017-02-13 (×4): 25 ug via INTRAVENOUS

## 2017-02-13 MED ORDER — SIMETHICONE 80 MG PO CHEW
40.0000 mg | CHEWABLE_TABLET | Freq: Four times a day (QID) | ORAL | Status: DC | PRN
  Administered 2017-02-13 – 2017-02-14 (×4): 40 mg via ORAL
  Administered 2017-02-14: 80 mg via ORAL
  Filled 2017-02-13 (×5): qty 1

## 2017-02-13 MED ORDER — SUGAMMADEX SODIUM 200 MG/2ML IV SOLN
INTRAVENOUS | Status: AC
  Filled 2017-02-13: qty 2

## 2017-02-13 MED ORDER — ONDANSETRON HCL 4 MG/2ML IJ SOLN: 4.0000 mg | Freq: Four times a day (QID) | INTRAMUSCULAR | Status: DC | PRN

## 2017-02-13 MED ORDER — SUCCINYLCHOLINE CHLORIDE 20 MG/ML IJ SOLN
INTRAMUSCULAR | Status: DC | PRN
  Administered 2017-02-13: 120 mg via INTRAVENOUS

## 2017-02-13 MED ORDER — PHENYLEPHRINE HCL 10 MG/ML IJ SOLN
INTRAMUSCULAR | Status: DC | PRN
  Administered 2017-02-13 (×4): 100 ug via INTRAVENOUS
  Administered 2017-02-13: 200 ug via INTRAVENOUS
  Administered 2017-02-13: 100 ug via INTRAVENOUS

## 2017-02-13 MED ORDER — ONDANSETRON HCL 4 MG/2ML IJ SOLN
INTRAMUSCULAR | Status: AC
  Filled 2017-02-13: qty 2

## 2017-02-13 MED ORDER — ROCURONIUM BROMIDE 100 MG/10ML IV SOLN
INTRAVENOUS | Status: DC | PRN
  Administered 2017-02-13: 20 mg via INTRAVENOUS
  Administered 2017-02-13: 50 mg via INTRAVENOUS
  Administered 2017-02-13: 10 mg via INTRAVENOUS

## 2017-02-13 MED ORDER — DEXAMETHASONE SODIUM PHOSPHATE 10 MG/ML IJ SOLN
INTRAMUSCULAR | Status: AC
  Filled 2017-02-13: qty 1

## 2017-02-13 MED ORDER — LIDOCAINE HCL (PF) 2 % IJ SOLN
INTRAMUSCULAR | Status: AC
  Filled 2017-02-13: qty 2

## 2017-02-13 MED ORDER — PHENYLEPHRINE HCL 10 MG/ML IJ SOLN
INTRAMUSCULAR | Status: AC
Start: 2017-02-13 — End: 2017-02-13
  Filled 2017-02-13: qty 1

## 2017-02-13 SURGICAL SUPPLY — 56 items
ADHESIVE MASTISOL STRL (MISCELLANEOUS) IMPLANT
BAG DECANTER FOR FLEXI CONT (MISCELLANEOUS) IMPLANT
BLADE SURG 15 STRL SS SAFETY (BLADE) ×3 IMPLANT
BLADE SURG SZ11 CARB STEEL (BLADE) IMPLANT
CANISTER SUCT 1200ML W/VALVE (MISCELLANEOUS) ×3 IMPLANT
CATH ROBINSON RED A/P 16FR (CATHETERS) ×3 IMPLANT
CATH TRAY 16F METER LATEX (MISCELLANEOUS) IMPLANT
CHLORAPREP W/TINT 26ML (MISCELLANEOUS) ×3 IMPLANT
CLEANER CAUTERY TIP 5X5 PAD (MISCELLANEOUS) IMPLANT
CLOSURE WOUND 1/2 X4 (GAUZE/BANDAGES/DRESSINGS)
CORD MONOPOLAR M/FML 12FT (MISCELLANEOUS) IMPLANT
COVER MAYO STAND STRL (DRAPES) IMPLANT
DRAPE LEGGINS SURG 28X43 STRL (DRAPES) IMPLANT
DRAPE XRAY CASSETTE 23X24 (DRAPES) IMPLANT
ELECT REM PT RETURN 9FT ADLT (ELECTROSURGICAL) ×3
ELECTRODE REM PT RTRN 9FT ADLT (ELECTROSURGICAL) ×1 IMPLANT
GOWN STRL REUS W/ TWL LRG LVL3 (GOWN DISPOSABLE) ×2 IMPLANT
GOWN STRL REUS W/TWL LRG LVL3 (GOWN DISPOSABLE) ×4
GOWN STRL REUS W/TWL XL LVL3 (GOWN DISPOSABLE) ×6 IMPLANT
HANDLE YANKAUER SUCT BULB TIP (MISCELLANEOUS) IMPLANT
IRRIGATION STRYKERFLOW (MISCELLANEOUS) ×1 IMPLANT
IRRIGATOR STRYKERFLOW (MISCELLANEOUS) ×3
IV LACTATED RINGERS 1000ML (IV SOLUTION) ×3 IMPLANT
KIT PINK PAD W/HEAD ARE REST (MISCELLANEOUS)
KIT PINK PAD W/HEAD ARM REST (MISCELLANEOUS) IMPLANT
KIT RM TURNOVER CYSTO AR (KITS) ×3 IMPLANT
LIGASURE LAP MARYLAND 5MM 37CM (ELECTROSURGICAL) ×3 IMPLANT
NEEDLE HYPO 22GX1.5 SAFETY (NEEDLE) ×3 IMPLANT
NS IRRIG 500ML POUR BTL (IV SOLUTION) ×3 IMPLANT
PACK GYN LAPAROSCOPIC (MISCELLANEOUS) ×3 IMPLANT
PAD CLEANER CAUTERY TIP 5X5 (MISCELLANEOUS)
PAD OB MATERNITY 4.3X12.25 (PERSONAL CARE ITEMS) ×3 IMPLANT
PAD PREP 24X41 OB/GYN DISP (PERSONAL CARE ITEMS) ×3 IMPLANT
POUCH ENDO CATCH 10MM SPEC (MISCELLANEOUS) ×3 IMPLANT
SCISSORS METZENBAUM CVD 33 (INSTRUMENTS) IMPLANT
SLEEVE ENDOPATH XCEL 5M (ENDOMECHANICALS) ×3 IMPLANT
SPONGE LAP 18X18 5 PK (GAUZE/BANDAGES/DRESSINGS) ×3 IMPLANT
SPONGE XRAY 4X4 16PLY STRL (MISCELLANEOUS) IMPLANT
STRIP CLOSURE SKIN 1/2X4 (GAUZE/BANDAGES/DRESSINGS) IMPLANT
SUT VIC AB 0 CT1 18XCR BRD 8 (SUTURE) IMPLANT
SUT VIC AB 0 CT1 36 (SUTURE) IMPLANT
SUT VIC AB 0 CT1 8-18 (SUTURE)
SUT VIC AB 3-0 SH 27 (SUTURE) ×2
SUT VIC AB 3-0 SH 27X BRD (SUTURE) ×1 IMPLANT
SUT VIC AB 4-0 PS2 18 (SUTURE) ×3 IMPLANT
SUT VICRYL 0 AB UR-6 (SUTURE) ×3 IMPLANT
SUT VICRYL 0 UR6 27IN ABS (SUTURE) IMPLANT
SYR 3ML LL SCALE MARK (SYRINGE) IMPLANT
SYRINGE 10CC LL (SYRINGE) IMPLANT
TOWEL OR 17X26 4PK STRL BLUE (TOWEL DISPOSABLE) IMPLANT
TROCAR ENDO BLADELESS 11MM (ENDOMECHANICALS) ×3 IMPLANT
TROCAR XCEL NON-BLD 5MMX100MML (ENDOMECHANICALS) ×3 IMPLANT
TROCAR XCEL UNIV SLVE 11M 100M (ENDOMECHANICALS) ×3 IMPLANT
TUBING CONNECTING 10 (TUBING) IMPLANT
TUBING CONNECTING 10' (TUBING)
TUBING INSUFFLATOR HI FLOW (MISCELLANEOUS) ×3 IMPLANT

## 2017-02-13 NOTE — H&P (Signed)
PRE-OPERATIVE HISTORY AND PHYSICAL EXAM  PCP:  Patient, No Pcp Per Subjective:   HPI:  [name] is a 31 y.o. G1P1000.  Patient's last menstrual period was 02/11/2017.  She presents today for a pre-op discussion and PE.  She has the following symptoms:  Acute onset lower abdominal pain.  Review of Systems:   Constitutional: Denied constitutional symptoms, night sweats, recent illness, fatigue, fever, insomnia and weight loss.  Eyes: Denied eye symptoms, eye pain, photophobia, vision change and visual disturbance.  Ears/Nose/Throat/Neck: Denied ear, nose, throat or neck symptoms, hearing loss, nasal discharge, sinus congestion and sore throat.  Cardiovascular: C/o lightheadedness  Respiratory: Denied pulmonary symptoms, asthma, pleuritic pain, productive sputum, cough, dyspnea and wheezing.  Gastrointestinal: Denied, gastro-esophageal reflux, melena, nausea and vomiting.  Genitourinary: Right sided abdominal pain  Musculoskeletal: Denied musculoskeletal symptoms, stiffness, swelling, muscle weakness and myalgia.  Dermatologic: Denied dermatology symptoms, rash and scar.  Neurologic: Denied neurology symptoms, dizziness, headache, neck pain and syncope.  Psychiatric: Denied psychiatric symptoms, anxiety and depression.  Endocrine: Denied endocrine symptoms including hot flashes and night sweats.   OB History  Gravida Para Term Preterm AB Living  1 1 1  0 0 0  SAB TAB Ectopic Multiple Live Births  0 0 0 0 0    # Outcome Date GA Lbr Len/2nd Weight Sex Delivery Anes PTL Lv  1 Term 08/03/16 [redacted]w[redacted]d 08:45 / 02:10 7 lb 6.7 oz (3.365 kg) F Vag-Spont EPI  LIV      Past Medical History:  Diagnosis Date  . Anxiety   . Chlamydia   . Gonorrhea   . Hypertension   . Seizures (HCC)     Past Surgical History:  Procedure Laterality Date  . CHOLECYSTECTOMY  2015  . COLPOSCOPY        SOCIAL HISTORY: History  Smoking Status  . Never Smoker  Smokeless Tobacco  . Never  Used   History  Alcohol Use No   History  Drug Use No    No family history on file.  ALLERGIES:  Bupropion  MEDS:   No current facility-administered medications on file prior to encounter.    Current Outpatient Prescriptions on File Prior to Encounter  Medication Sig Dispense Refill  . EPINEPHrine 0.3 mg/0.3 mL IJ SOAJ injection Inject 0.3 mg into the muscle once.    Marland Kitchen ibuprofen (ADVIL,MOTRIN) 600 MG tablet Take 1 tablet (600 mg total) by mouth every 6 (six) hours as needed. 40 tablet 1  . Prenatal Vit-Fe Fumarate-FA (PRENATAL MULTIVITAMIN) TABS tablet Take 1 tablet by mouth daily at 12 noon.    . ranitidine (ZANTAC) 150 MG tablet Take 150 mg by mouth daily.      Meds ordered this encounter  Medications  . sodium chloride 0.9 % bolus 1,000 mL  . fentaNYL (SUBLIMAZE) injection 25 mcg  . ondansetron (ZOFRAN) injection 4 mg  . sodium chloride 0.9 % bolus 1,000 mL  . norethindrone (MICRONOR,CAMILA,ERRIN) 0.35 MG tablet    Sig: Take 1 tablet by mouth daily.    Refill:  0  . fentaNYL (SUBLIMAZE) injection 25 mcg     Physical examination BP 110/71   Pulse 75   Temp 97.5 F (36.4 C) (Oral)   Resp 18   Ht 5\' 8"  (1.727 m)   Wt 243 lb (110.2 kg)   LMP 02/11/2017   SpO2 98%   BMI 36.95 kg/m   General NAD, Conversant  HEENT Atraumatic; Op clear with mmm.  Normo-cephalic. Pupils reactive. Anicteric sclerae  Thyroid/Neck Smooth without nodularity or enlargement. Normal ROM.  Neck Supple.  Skin No rashes, lesions or ulceration. Normal palpated skin turgor. No nodularity.  Breasts: No masses or discharge.  Symmetric.  No axillary adenopathy.  Lungs: Clear to auscultation.No rales or wheezes. Normal Respiratory effort, no retractions.  Heart: NSR.  No murmurs or rubs appreciated. No periferal edema  Abdomen: Soft.  Rebound tenderness, guarding present  No HSM  Extremities: Moves all appropriately.  Normal ROM for age. No lymphadenopathy.  Neuro: Oriented to PPT.  Normal mood.  Normal affect.     Pelvic:  Deferred for OR  U/S :  Right tubal ectopic pregnancy.  Free fluid present. (not excessive)  CRL = 8 wks U/S images personally reviewed   Assessment:   G1P1000 Patient Active Problem List   Diagnosis Date Noted  . SVD (spontaneous vaginal delivery) 08/03/2016  . Postpartum care following vaginal delivery 08/03/2016  . PIH (pregnancy induced hypertension), third trimester 08/01/2016    1. Syncope, unspecified syncope type   2. Ruptured right tubal ectopic pregnancy causing hemoperitoneum   3. Hypotension, unspecified hypotension type      Plan:   1.  Right Salpingectomy  Pre-op discussions regarding Risks and Benefits of her scheduled surgery.  LPY for Pain We have discussed the procedure of Exploratory Laparoscopy in detail.   I have informed her that Laparoscopy, like other surgical procedures, entails the following risks:  bleeding, infection, damage to bowel, bladder or other internal organ, and the risk or anesthesia.  She is aware that her risks are not limited to these.    We have discussed the likely removal of her tube.  We have discussed future fertility.  The possibility of laparotomy was also discussed and she understands that this may become necessary to control her bleeding or manage her ectopic.  I have answered all of her questions and I believe she has been well informed regarding the risks/benefits of Exploratory Laparoscopy for ectopic pregnancy. We have also dicussed the possibility of receiving blood or blood products and she I aware that this may be necessary.  She had no objection to this if necessary.   Elonda Huskyavid J. Evans, M.D. 02/13/2017 5:18 AM

## 2017-02-13 NOTE — Anesthesia Postprocedure Evaluation (Signed)
Anesthesia Post Note  Patient: [name]  Procedure(s) Performed: Procedure(s) (LRB): DIAGNOSTIC LAPAROSCOPY WITH REMOVAL OF ECTOPIC PREGNANCY, right salpingectomy (Right)  Patient location during evaluation: PACU Anesthesia Type: General Level of consciousness: awake and alert Pain management: pain level controlled Vital Signs Assessment: post-procedure vital signs reviewed and stable Respiratory status: spontaneous breathing, nonlabored ventilation, respiratory function stable and patient connected to nasal cannula oxygen Cardiovascular status: blood pressure returned to baseline and stable Postop Assessment: no signs of nausea or vomiting Anesthetic complications: no     Last Vitals:  Vitals:   02/13/17 1008 02/13/17 1102  BP: 121/77 115/73  Pulse: 92 96  Resp: 18 20  Temp: 36.7 C 36.8 C    Last Pain:  Vitals:   02/13/17 1102  TempSrc: Oral  PainSc:                  [name] S

## 2017-02-13 NOTE — ED Notes (Addendum)
Pt reports having a child 6 mths ago and LMP was "a few days ago." Pt states lower abd cramping however is pointing to pelvic region. Pt states when the pain comes it radiates to her right shoulder then goes everywhere per pt. Pt reports hx of gallbladder removal. Pt also reports she has cut back on her eating due to wanting to lose weight.  Pt also states "I feel like I am constipated however I am having bowel movements daily, my last BM was less than normal however I have not been eating as much as I use to." Pt states when she tried to have a BM this AM prior to arrival she felt like she was going to pass out.  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

## 2017-02-13 NOTE — Anesthesia Procedure Notes (Signed)
Procedure Name: Intubation Date/Time: 02/13/2017 5:58 AM Performed by: Nelda Marseille Pre-anesthesia Checklist: Patient identified, Patient being monitored, Timeout performed, Emergency Drugs available and Suction available Patient Re-evaluated:Patient Re-evaluated prior to inductionOxygen Delivery Method: Circle system utilized Preoxygenation: Pre-oxygenation with 100% oxygen Intubation Type: IV induction Ventilation: Mask ventilation without difficulty Laryngoscope Size: Mac and 3 Grade View: Grade III Tube type: Oral Tube size: 7.0 mm Number of attempts: 1 Airway Equipment and Method: Stylet Placement Confirmation: ETT inserted through vocal cords under direct vision,  positive ETCO2 and breath sounds checked- equal and bilateral Secured at: 21 cm Tube secured with: Tape Dental Injury: Teeth and Oropharynx as per pre-operative assessment

## 2017-02-13 NOTE — Anesthesia Preprocedure Evaluation (Signed)
Anesthesia Evaluation  Patient identified by MRN, date of birth, ID band Patient awake    Reviewed: Allergy & Precautions, H&P , NPO status , Patient's Chart, lab work & pertinent test results, reviewed documented beta blocker date and time   Airway Mallampati: II  TM Distance: >3 FB Neck ROM: full    Dental  (+) Teeth Intact   Pulmonary neg pulmonary ROS,    Pulmonary exam normal        Cardiovascular hypertension, negative cardio ROS Normal cardiovascular exam Rhythm:regular Rate:Normal     Neuro/Psych Seizures -, Well Controlled,  negative neurological ROS  negative psych ROS   GI/Hepatic negative GI ROS, Neg liver ROS,   Endo/Other  negative endocrine ROS  Renal/GU negative Renal ROS  negative genitourinary   Musculoskeletal   Abdominal   Peds  Hematology negative hematology ROS (+)   Anesthesia Other Findings Past Medical History: No date: Anxiety No date: Chlamydia No date: Gonorrhea No date: Hypertension No date: Seizures [facility](HCC) Past Surgical History: 2015: CHOLECYSTECTOMY No date: COLPOSCOPY BMI    Body Mass Index:  36.95 kg/m     Reproductive/Obstetrics negative OB ROS                             Anesthesia Physical Anesthesia Plan  ASA: II and emergent  Anesthesia Plan: General ETT   Post-op Pain Management:    Induction:   PONV Risk Score and Plan:   Airway Management Planned:   Additional Equipment:   Intra-op Plan:   Post-operative Plan:   Informed Consent: I have reviewed the patients History and Physical, chart, labs and discussed the procedure including the risks, benefits and alternatives for the proposed anesthesia with the patient or authorized representative who has indicated his/her understanding and acceptance.   Dental Advisory Given  Plan Discussed with: CRNA  Anesthesia Plan Comments:         Anesthesia Quick Evaluation

## 2017-02-13 NOTE — ED Notes (Signed)
Surgical consent signed and in chart.

## 2017-02-13 NOTE — ED Notes (Signed)
Ultrasound called to verify order notification was received.

## 2017-02-13 NOTE — Transfer of Care (Signed)
Immediate Anesthesia Transfer of Care Note  Patient: [name]  Procedure(s) Performed: Procedure(s): DIAGNOSTIC LAPAROSCOPY WITH REMOVAL OF ECTOPIC PREGNANCY, right salpingectomy (Right)  Patient Location: PACU  Anesthesia Type:General  Level of Consciousness: drowsy and patient cooperative  Airway & Oxygen Therapy: Patient Spontanous Breathing and Patient connected to nasal cannula oxygen  Post-op Assessment: Report given to RN, Post -op Vital signs reviewed and stable and Patient moving all extremities X 4  Post vital signs: Reviewed and stable  Last Vitals:  Vitals:   02/13/17 0515 02/13/17 0743  BP: 110/71 (!) 162/139  Pulse: 75 (!) 120  Resp: 18 14  Temp:  36.6 C    Last Pain:  Vitals:   02/13/17 0416  TempSrc:   PainSc: 10-Worst pain ever         Complications: No apparent anesthesia complications

## 2017-02-13 NOTE — Anesthesia Post-op Follow-up Note (Cosign Needed)
Anesthesia QCDR form completed.        

## 2017-02-13 NOTE — ED Triage Notes (Signed)
Per EMS, pt reports sudden onset lower abd pain. Pt states she "feels constipated however I am having BM's, last BM was less than normal." Pt states the pain radiates to right shoulder. Pt denies hx of this pain before. Pt also reports "I have felt like I was going to faint 4 times tonight", pt denies LOC at this time. Pt is alert and oriented at this time. Upon arrival, pale lips noted to pt and pt reporting "I feel like I could pass out." No LOC since arrival.  Pt states "5 years ago she was treated for alcoholism, pt denies having cirrhosis or esophageal varices at this time."

## 2017-02-13 NOTE — ED Provider Notes (Signed)
[facility] Emergency Department Provider Note   ____________________________________________   First MD Initiated Contact with Patient 02/13/17 [phone]     (approximate)  I have reviewed the triage vital signs and the nursing notes.   HISTORY  Chief Complaint Abdominal Pain and Near Syncope    HPI Shuna Tabor is a 31 y.o. female who presents to the ED from home EMS with a chief complaint of abdominal pain and near syncope. Patient reports awakening with sudden onset of midline lower abdominal pain radiating to her right shoulder. Reports feeling constipated although she is having bowel movements. Had near syncopal episode 4 times tonight; last episode while she was on the commode. Complains of continued lightheadedness. Denies vaginal bleeding. Reports last menstrual period last week. No prior history of ovarian cysts or endometriosis. Denies recent fever, chills, chest pain, shortness of breath, nausea, vomiting, dysuria. Denies recent travel or trauma. Nothing makes her symptoms better or worse.   Past Medical History:  Diagnosis Date  . Anxiety   . Chlamydia   . Gonorrhea   . Hypertension   . Seizures (HCC)   Patient DENIES cirrhosis, esophageal varices, seizures. States her GI work-up was negative for cirrhosis and varices. States she had 1 seizure secondary to taking Wellbutrin.  Patient Active Problem List   Diagnosis Date Noted  . SVD (spontaneous vaginal delivery) 08/03/2016  . Postpartum care following vaginal delivery 08/03/2016  . PIH (pregnancy induced hypertension), third trimester 08/01/2016    Past Surgical History:  Procedure Laterality Date  . CHOLECYSTECTOMY  2015  . COLPOSCOPY      Prior to Admission medications   Medication Sig Start Date End Date Taking? Authorizing Provider  EPINEPHrine 0.3 mg/0.3 mL IJ SOAJ injection Inject 0.3 mg into the muscle once.   Yes [provider]    Allergies Bupropion  No  family history on file.  Social History Social History  Substance Use Topics  . Smoking status: Never Smoker  . Smokeless tobacco: Never Used  . Alcohol use No    Review of Systems  Constitutional: No fever/chills. Eyes: No visual changes. ENT: No sore throat. Cardiovascular: Denies chest pain. Respiratory: Denies shortness of breath. Gastrointestinal: Positive for lower abdominal pain.  No nausea, no vomiting.  No diarrhea.  No constipation. Genitourinary: Negative for dysuria. Musculoskeletal: Negative for back pain. Skin: Negative for rash. Neurological: Positive for lightheadedness and near syncope. Negative for headaches, focal weakness or numbness.   ____________________________________________   PHYSICAL EXAM:  VITAL SIGNS: ED Triage Vitals  Enc Vitals Group     BP 02/13/17 0238 123/65     Pulse Rate 02/13/17 0238 79     Resp 02/13/17 0238 (!) 22     Temp 02/13/17 0240 97.5 F (36.4 C)     Temp Source 02/13/17 0238 Oral     SpO2 02/13/17 0238 100 %     Weight 02/13/17 0240 243 lb (110.2 kg)     Height 02/13/17 0240 5\' 8"  (1.727 m)     Head Circumference --      Peak Flow --      Pain Score 02/13/17 0238 10     Pain Loc --      Pain Edu? --      Excl. in GC? --     Constitutional: Alert and oriented. Ill appearing and in moderate acute distress. Eyes: Conjunctivae are normal. PERRL. EOMI. Head: Atraumatic. Nose: No congestion/rhinnorhea. Mouth/Throat: Mucous membranes are moist.  Oropharynx non-erythematous. Neck:  No stridor.   Cardiovascular: Normal rate, regular rhythm. Grossly normal heart sounds.  Good peripheral circulation. Respiratory: Normal respiratory effort.  No retractions. Lungs CTAB. Gastrointestinal: Soft and exquisitely tender to palpation lower abdomen with rebound and guarding. No distention. No abdominal bruits. No CVA tenderness. Musculoskeletal: No lower extremity tenderness nor edema.  No joint effusions. Neurologic:  Normal  speech and language. No gross focal neurologic deficits are appreciated.  Skin:  Skin is pale, warm, dry and intact. No rash noted. Psychiatric: Mood and affect are normal. Speech and behavior are normal.  ____________________________________________   LABS (all labs ordered are listed, but only abnormal results are displayed)  Labs Reviewed  CBC WITH DIFFERENTIAL/PLATELET - Abnormal; Notable for the following:       Result Value   WBC 12.5 (*)    Neutro Abs 8.3 (*)    All other components within normal limits  COMPREHENSIVE METABOLIC PANEL - Abnormal; Notable for the following:    Potassium 3.1 (*)    Glucose, Bld 149 (*)    All other components within normal limits  URINALYSIS, COMPLETE (UACMP) WITH MICROSCOPIC - Abnormal; Notable for the following:    Color, Urine AMBER (*)    APPearance HAZY (*)    Protein, ur 30 (*)    Squamous Epithelial / LPF 0-5 (*)    All other components within normal limits  AMMONIA - Abnormal; Notable for the following:    Ammonia <9 (*)    All other components within normal limits  HCG, QUANTITATIVE, PREGNANCY - Abnormal; Notable for the following:    hCG, Beta Chain, Quant, S [id number] (*)    All other components within normal limits  POCT PREGNANCY, URINE - Abnormal; Notable for the following:    Preg Test, Ur POSITIVE (*)    All other components within normal limits  ETHANOL  LIPASE, BLOOD  PROTIME-INR  TROPONIN I  URINE DRUG SCREEN, QUALITATIVE ([facility] ONLY)  POC URINE PREG, ED  TYPE AND SCREEN  SURGICAL PATHOLOGY   ____________________________________________  EKG  ED ECG REPORT I, Aaliya Maultsby J, the attending physician, personally viewed and interpreted this ECG.   Date: 02/13/2017  EKG Time: 0240  Rate: 89  Rhythm: normal EKG, normal sinus rhythm  Axis: Normal  Intervals:none  ST&T Change: Nonspecific  ____________________________________________  RADIOLOGY  [place] Ob Comp Less 14 Wks  Result Date: 02/13/2017 CLINICAL DATA:   Pregnant patient in first-trimester pregnancy with pelvic pain and hypotension. EXAM: OBSTETRIC <14 WK ULTRASOUND TECHNIQUE: Transabdominal ultrasound was performed for evaluation of the gestation as well as the maternal uterus and adnexal regions. COMPARISON:  None. FINDINGS: Intrauterine gestational sac: None Yolk sac:  Not Visualized. Embryo:  Visualized in the right adnexa. Cardiac Activity: Likely visualized. Heart Rate: Not discretely measured. CRL:   16.5  mm   8 w 1 d Subchorionic hemorrhage:  Not applicable. Maternal uterus/adnexae: Within the right adnexa is a gestational sac containing an embryo. Right ovary is not discretely visualize separately. The left ovary is normal. Moderate volume of free fluid in the pelvis and right upper quadrant. IMPRESSION: Right adnexal ectopic pregnancy. Moderate free fluid suspicious for rupture. Critical Value/emergent results were called by telephone at the time of interpretation on 02/13/2017 at 4:56 am to Dr. Chiquita LothJADE Asma Boldon , who verbally acknowledged these results. Electronically Signed   By: Rubye OaksMelanie  Ehinger M.D.   On: 02/13/2017 04:56    ____________________________________________   PROCEDURES  Procedure(s) performed: None  Procedures  Critical Care performed: Yes, see critical  care note(s)   CRITICAL CARE Performed by: Irean Hong   Total critical care time: 45 minutes  Critical care time was exclusive of separately billable procedures and treating other patients.  Critical care was necessary to treat or prevent imminent or life-threatening deterioration.  Critical care was time spent personally by me on the following activities: development of treatment plan with patient and/or surrogate as well as nursing, discussions with consultants, evaluation of patient's response to treatment, examination of patient, obtaining history from patient or surrogate, ordering and performing treatments and interventions, ordering and review of laboratory studies,  ordering and review of radiographic studies, pulse oximetry and re-evaluation of patient's condition.  ____________________________________________   INITIAL IMPRESSION / ASSESSMENT AND PLAN / ED COURSE  Pertinent labs & imaging results that were available during my care of the patient were reviewed by me and considered in my medical decision making (see chart for details).  31 year old female who presents with acute onset of lower abdominal pain. Urine POCT is positive for pregnancy. Patient hypotensive with systolic in the upper 80s; IV fluids infusing with improvement in blood pressure. Stat bedside ultrasound ordered to evaluate for ruptured ectopic pregnancy. Screening lab work ordered including type and screen.  Clinical Course as of Feb 14 652  Tue Feb 13, 2017  0416 Verbal report per sonographer: Ruptured right sided ectopic, approximately 8 weeks with free fluid tracking to liver. Will page unassigned OB/GYN.  [JS]  [phone] Spoke with Dr. Logan Bores who will evaluate patient in the emergency department. Updated patient and spouse.  [JS]    Clinical Course User Index [JS] Irean Hong, MD     ____________________________________________   FINAL CLINICAL IMPRESSION(S) / ED DIAGNOSES  Final diagnoses:  Syncope, unspecified syncope type  Ruptured right tubal ectopic pregnancy causing hemoperitoneum  Hypotension, unspecified hypotension type      NEW MEDICATIONS STARTED DURING THIS VISIT:  Current Discharge Medication List       Note:  This document was prepared using Dragon voice recognition software and may include unintentional dictation errors.    Irean Hong, MD 02/13/17 [phone]

## 2017-02-13 NOTE — Op Note (Signed)
@  LOGO@    OPERATIVE NOTE 02/13/2017 7:34 AM  PRE-OPERATIVE DIAGNOSIS:  1) right ectopic ruptured  POST-OPERATIVE DIAGNOSIS:  2)same with hemoperitoneum  OPERATION:  Laparoscopic right salpingectomy, irrigation  SURGEON(S): Surgeon(s) and Role:    Linzie Collin* Giulian Goldring James, MD - Primary   ANESTHESIA: General  ESTIMATED BLOOD LOSS:123800ml OPERATIVE FINDINGS: Right tubal ectopic pregnancy with hemoperitoneum  SPECIMEN:  ID Type Source Tests Collected by Time Destination  1 : right fallopian tube and [facility] Other SURGICAL PATHOLOGY Linzie CollinEvans, Rudene Poulsen James, MD 02/13/2017 [phone]     COMPLICATIONS: None  DISPOSITION: Stable to recovery room  DESCRIPTION OF PROCEDURE:      The patient was prepped and draped in the dorsolithotomy position and placed under general anesthesia. The bladder was emptied. The cervix was grasped with a multi-toothed tenaculum and a uterine manipulator was placed within the cervical os respecting the position and curvature of the uterus. After changing gloves we proceeded abdominally. A small infraumbilical incision was made and a 5 mm trocar port was placed within the abdominopelvic cavity. The opening pressure was less than 7 mmHg.  Approximately 3 and 1/2 L of carbon dioxide gas was instilled within the abdominal pelvic cavity. The laparoscope was placed and the pelvis and abdomen were carefully inspected.  We immediately encountered a large amount of intra-abdominal blood with clot.   An 11 mm port was placed in the left lower quadrant under direct visualization.  A 5 mm port was placed in the right lower quadrant under direct visualization. A large section was placed through the left lower quadrant port and a significant amount of blood and clots was removed allowing visualization of the right adnexa. The ectopic and right fallopian tube were identified. The mesenteric aspect of the fallopian tube was grasped and coagulated and divided. The fallopian tube was then  coagulated and divided.  The specimen was placed within an Endo Catch bag and removed through the left lower quadrant port. The remainder of the case was spent irrigating and suctioning out blood and clot. She was placed in reverse Trendelenburg as well as Trendelenburg and all areas were suction irrigated to obtain as much blood and clot as possible.  The right adnexa was carefully visualized and hemostasis was noted. No other abnormalities were encountered.   The laparoscope was removed the trocar sleeves were removed and the  left lower quadrant port incision was closed with a deep suture through the fascia of 0 Vicryl followed by subcuticular closure of the skin.  The other 2 openings were closed with 4-0 subcuticular sutures.  A long-acting anesthetic was injected.  Steri-Strips were applied. The uterine manipulator was removed. Hemostasis of the cervix was noted. The patient went to the recovery room in stable condition.   Elonda Huskyavid J. Mazie Fencl, M.D. 02/13/2017 7:34 AM

## 2017-02-14 LAB — SURGICAL PATHOLOGY

## 2017-02-14 MED ORDER — OXYCODONE-ACETAMINOPHEN 5-325 MG PO TABS: 1.0000 | ORAL_TABLET | ORAL | 0 refills | Status: DC | PRN

## 2017-02-14 NOTE — Progress Notes (Signed)
Pt discharged home.  Discharge instructions, prescriptions and follow up appointment given to and reviewed with pt.  Pt verbalized understanding.  Escorted by auxillary. 

## 2017-02-14 NOTE — Discharge Summary (Signed)
@  LOGO@  Discharge Summary  Admit date: 02/13/2017  Discharge Date and Time:02/14/2017  1:31 PM  Discharge to:  Home  Admission Diagnosis: Active Problems:   Ectopic pregnancy, tubal   Discharge  Diagnoses: same  OR Procedures:   Procedure(s): DIAGNOSTIC LAPAROSCOPY WITH REMOVAL OF ECTOPIC PREGNANCY, right salpingectomy Date -------------------                              Discharge Day Progress Note:   Subjective:   The patient does not have complaints.  She is ambulating well. She is taking PO well. Her pain is well controlled with her current medications. She is urinating without difficulty and is passing flatus.   Objective:  BP (!) 99/55 (BP Location: Right Arm)   Pulse 79   Temp 99.1 F (37.3 C) (Oral) Comment: notify RN, pt having some chills as well  Resp 20   Ht 5\' 8"  (1.727 m)   Wt 243 lb (110.2 kg)   LMP 02/11/2017   SpO2 99%   BMI 36.95 kg/m     Abdomen:                          clean, dry    Assessment:   Doing well.  Normal progress as expected.   No lightheadedness  Plan:        Discharge home.                       Medications as directed.  Hospital Course:   unremarkable   Condition at Discharge:  good Discharge Medications:  Percoce  Follow Up:   F/U  1 week  Elonda Huskyavid J. Evans, M.D. 02/14/2017 1:31 PM

## 2017-02-19 LAB — GLUCOSE, CAPILLARY: Glucose-Capillary: 139 mg/dL — ABNORMAL HIGH (ref 65–99)

## 2017-10-26 ENCOUNTER — Encounter: Payer: Self-pay | Admitting: *Deleted

## 2017-10-26 ENCOUNTER — Other Ambulatory Visit: Payer: Self-pay

## 2017-10-26 ENCOUNTER — Emergency Department: Payer: Self-pay

## 2017-10-26 ENCOUNTER — Emergency Department
Admission: EM | Admit: 2017-10-26 | Discharge: 2017-10-27 | Disposition: A | Discharge status: Home / Self Care | Payer: Self-pay | Attending: Emergency Medicine | Admitting: Emergency Medicine

## 2017-10-26 DIAGNOSIS — Z9049 Acquired absence of other specified parts of digestive tract: Secondary | ICD-10-CM | POA: Insufficient documentation

## 2017-10-26 DIAGNOSIS — F419 Anxiety disorder, unspecified: Secondary | ICD-10-CM | POA: Insufficient documentation

## 2017-10-26 DIAGNOSIS — R102 Pelvic and perineal pain: Secondary | ICD-10-CM

## 2017-10-26 DIAGNOSIS — N83201 Unspecified ovarian cyst, right side: Secondary | ICD-10-CM | POA: Insufficient documentation

## 2017-10-26 DIAGNOSIS — I10 Essential (primary) hypertension: Secondary | ICD-10-CM | POA: Insufficient documentation

## 2017-10-26 HISTORY — DX: Unspecified ectopic pregnancy without intrauterine pregnancy: O00.90

## 2017-10-26 LAB — URINALYSIS, COMPLETE (UACMP) WITH MICROSCOPIC
BACTERIA UA: NONE SEEN
BILIRUBIN URINE: NEGATIVE
GLUCOSE, UA: NEGATIVE mg/dL
HGB URINE DIPSTICK: NEGATIVE
Ketones, ur: NEGATIVE mg/dL
LEUKOCYTES UA: NEGATIVE
NITRITE: NEGATIVE
PROTEIN: NEGATIVE mg/dL
Specific Gravity, Urine: 1.018 (ref 1.005–1.030)
pH: 6 (ref 5.0–8.0)

## 2017-10-26 LAB — COMPREHENSIVE METABOLIC PANEL
ALBUMIN: 5.1 g/dL — AB (ref 3.5–5.0)
ALT: 18 U/L (ref 14–54)
AST: 24 U/L (ref 15–41)
Alkaline Phosphatase: 58 U/L (ref 38–126)
Anion gap: 10 (ref 5–15)
BILIRUBIN TOTAL: 1 mg/dL (ref 0.3–1.2)
BUN: 14 mg/dL (ref 6–20)
CO2: 24 mmol/L (ref 22–32)
Calcium: 10.2 mg/dL (ref 8.9–10.3)
Chloride: 104 mmol/L (ref 101–111)
Creatinine, Ser: 0.67 mg/dL (ref 0.44–1.00)
GFR calc non Af Amer: >60 mL/min (ref >60)
GLUCOSE: 108 mg/dL — AB (ref 65–99)
POTASSIUM: 3.6 mmol/L (ref 3.5–5.1)
SODIUM: 138 mmol/L (ref 135–145)
TOTAL PROTEIN: 8 g/dL (ref 6.5–8.1)

## 2017-10-26 LAB — LIPASE, BLOOD: Lipase: 38 U/L (ref 11–51)

## 2017-10-26 LAB — CBC
HEMATOCRIT: 43.2 % (ref 35.0–47.0)
Hemoglobin: 15 g/dL (ref 12.0–16.0)
MCH: 31.7 pg (ref 26.0–34.0)
MCHC: 34.6 g/dL (ref 32.0–36.0)
MCV: 91.4 fL (ref 80.0–100.0)
Platelets: 220 10*3/uL (ref 150–440)
RBC: 4.73 MIL/uL (ref 3.80–5.20)
RDW: 12.4 % (ref 11.5–14.5)
WBC: 6.7 10*3/uL (ref 3.6–11.0)

## 2017-10-26 LAB — PREGNANCY, URINE: PREG TEST UR: NEGATIVE

## 2017-10-26 MED ORDER — LORAZEPAM 1 MG PO TABS
1.0000 mg | ORAL_TABLET | Freq: Once | ORAL | Status: DC
  Filled 2017-10-26: qty 1

## 2017-10-26 MED ORDER — KETOROLAC TROMETHAMINE 10 MG PO TABS
10.0000 mg | ORAL_TABLET | Freq: Once | ORAL | Status: AC
  Administered 2017-10-26: 10 mg via ORAL
  Filled 2017-10-26 (×2): qty 1

## 2017-10-26 NOTE — Discharge Instructions (Signed)
You may take Tylenol or Motrin for your pain.  Please follow-up with your gynecologist for reevaluation.  Return to the emergency department if you develop severe pain, lightheadedness or fainting, fever, or any other symptoms concerning to you.

## 2017-10-26 NOTE — ED Provider Notes (Signed)
[facility] Emergency Department Provider Note  ____________________________________________  Time seen: Approximately 9:46 PM  I have reviewed the triage vital signs and the nursing notes.   HISTORY  Chief Complaint Abdominal Pain    HPI [name] is a 32 y.o. female, nonpregnant, with a history of ectopic pregnancy 2018, presenting with right pelvic pain.  The patient reports that she got home from work today and had the acute onset of a severe suprapubic and right-sided pain which has been slowly easing off but is still present at this time.  She is sexually active and has had one episode of sexual intercourse without a condom.  Since her ectopic pregnancy, the patient has had irregular periods; LMP is 09/21/17.  The patient denies any fever or chills, dysuria, urinary frequency, hematuria, change in vaginal discharge.  She has not tried anything medication for her pain, but tried to "meditate into the pain," which seemed to help.  Past Medical History:  Diagnosis Date  . Anxiety   . Chlamydia   . Ectopic pregnancy   . Gonorrhea   . Hypertension   . Seizures [facility])     Patient Active Problem List   Diagnosis Date Noted  . Ectopic pregnancy, tubal 02/13/2017  . SVD (spontaneous vaginal delivery) 08/03/2016  . Postpartum care following vaginal delivery 08/03/2016  . PIH (pregnancy induced hypertension), third trimester 08/01/2016    Past Surgical History:  Procedure Laterality Date  . CHOLECYSTECTOMY  2015  . COLPOSCOPY    . DIAGNOSTIC LAPAROSCOPY WITH REMOVAL OF ECTOPIC PREGNANCY Right 02/13/2017   Procedure: DIAGNOSTIC LAPAROSCOPY WITH REMOVAL OF ECTOPIC PREGNANCY, right salpingectomy;  Surgeon: Linzie Collin, MD;  Location: [facility] ORS;  Service: Gynecology;  Laterality: Right;    Current Outpatient Rx  . Order #: [id number] Class: Print    Allergies Bupropion  History reviewed. No pertinent family history.  Social History Social  History   Tobacco Use  . Smoking status: Never Smoker  . Smokeless tobacco: Never Used  Substance Use Topics  . Alcohol use: No  . Drug use: No    Review of Systems Constitutional: No fever/chills. Eyes: No visual changes. ENT: No sore throat. No congestion or rhinorrhea. Cardiovascular: Denies chest pain. Denies palpitations. Respiratory: Denies shortness of breath.  No cough. Gastrointestinal: Positive for suprapubic and lower right abdominal pain.  No nausea, no vomiting.  No diarrhea.  No constipation. Genitourinary: Negative for dysuria.  Frequency.  No hematuria.  No change in vaginal discharge. Musculoskeletal: Negative for back pain. Skin: Negative for rash. Neurological: Negative for headaches. No focal numbness, tingling or weakness.     ____________________________________________   PHYSICAL EXAM:  VITAL SIGNS: ED Triage Vitals  Enc Vitals Group     BP 10/26/17 1736 (!) 149/96     Pulse Rate 10/26/17 1736 (!) 101     Resp 10/26/17 1736 16     Temp 10/26/17 1736 98.1 F (36.7 C)     Temp Source 10/26/17 1736 Oral     SpO2 10/26/17 1736 100 %     Weight 10/26/17 1742 200 lb (90.7 kg)     Height 10/26/17 1742 5\' 8"  (1.727 m)     Head Circumference --      Peak Flow --      Pain Score 10/26/17 1741 9     Pain Loc --      Pain Edu? --      Excl. in GC? --     Constitutional: Alert and  oriented. Well appearing and in no acute distress. Answers questions appropriately. Eyes: Conjunctivae are normal.  EOMI. No scleral icterus. Head: Atraumatic. Nose: No congestion/rhinnorhea. Mouth/Throat: Mucous membranes are moist.  Neck: No stridor.  Supple.   Cardiovascular: Normal rate, regular rhythm. No murmurs, rubs or gallops.  Respiratory: Normal respiratory effort.  No accessory muscle use or retractions. Lungs CTAB.  No wheezes, rales or ronchi. Gastrointestinal: Soft, and nondistended.  Minimal tenderness to palpation which is isolated to the suprapubic region.   No guarding or rebound.  No peritoneal signs. Musculoskeletal: No LE edema. Neurologic:  A&Ox3.  Speech is clear.  Face and smile are symmetric.  EOMI.  Moves all extremities well. Skin:  Skin is warm, dry and intact. No rash noted. Psychiatric: Depressed mood and anxious affect.  ____________________________________________   LABS (all labs ordered are listed, but only abnormal results are displayed)  Labs Reviewed  COMPREHENSIVE METABOLIC PANEL - Abnormal; Notable for the following components:      Result Value   Glucose, Bld 108 (*)    Albumin 5.1 (*)    All other components within normal limits  URINALYSIS, COMPLETE (UACMP) WITH MICROSCOPIC - Abnormal; Notable for the following components:   Color, Urine YELLOW (*)    APPearance HAZY (*)    Squamous Epithelial / LPF 0-5 (*)    All other components within normal limits  LIPASE, BLOOD  CBC  PREGNANCY, URINE  POC URINE PREG, ED   ____________________________________________  EKG  Not indicated ____________________________________________  RADIOLOGY  No results found.  ____________________________________________   PROCEDURES  Procedure(s) performed: None  Procedures  Critical Care performed: No ____________________________________________   INITIAL IMPRESSION / ASSESSMENT AND PLAN / ED COURSE  Pertinent labs & imaging results that were available during my care of the patient were reviewed by me and considered in my medical decision making (see chart for details).  32 y.o. female with a history of ectopic pregnancy presenting with acute onset of suprapubic and right pelvic pain.  Overall, the patient is afebrile.  Her symptoms do seem to be improving on their own.  Her laboratory studies are reassuring with normal electrolytes, normal lipase, negative pregnancy test, urinalysis that does not show UTI, and normal white blood cell count.  We will get an ultrasound of her pelvis given her surgical history and I  will initiate symptom medic treatment.  Plan reevaluation for final disposition.  ----------------------------------------- 11:07 PM on 10/26/2017 -----------------------------------------  The patient has a right ovarian cyst without torsion or rupture.  At this time, she is feeling significantly better and continues to be hemodynamically stable.  We will plan discharge with close gynecologic follow-up.  Return precautions were discussed and I did talk about red flag symptoms that would be concerning for torsion.  ____________________________________________  FINAL CLINICAL IMPRESSION(S) / ED DIAGNOSES  Final diagnoses:  Right ovarian cyst         NEW MEDICATIONS STARTED DURING THIS VISIT:  New Prescriptions   No medications on file      Rockne MenghiniNorman, Anne-Caroline, MD 10/26/17 2307

## 2017-10-26 NOTE — ED Notes (Signed)
Patient going to ultrasound 

## 2017-10-26 NOTE — ED Triage Notes (Signed)
1st RN: Pt c/o generalized abdominal pain starting at 1600 today. Pt has hx of ectopic pregbabcy. Pt does not know if she is presently pregnant now. Pt is extremely anxious. Pt drove herself to the ED and is ambulatory to the stat desk.

## 2017-10-27 NOTE — ED Notes (Addendum)
Went to d/c patient and pt was already gone. Patient was in  NAD last time patient was seen. Nurse attempted to call patient at home number and no answer.

## 2017-10-27 NOTE — ED Provider Notes (Signed)
-----------------------------------------   12:30 AM on 10/27/2017 -----------------------------------------  Pelvic ultrasound interpreted per Dr. Margo AyeHall:  1. Mild asymmetric enlargement of the right ovary appears related to  multiple physiologic right ovarian cysts, the largest of which is a  3.3 cm simple cyst (within normal limits for reproductive age  patients).  2. Normal left ovary and uterus. No pelvic free fluid.   Patient was not in the room when the official ultrasound results returned.  Nurse attempted to contact her via telephone but patient did not answer   Irean HongSung, Jade J, MD 10/27/17 [phone]

## 2019-04-01 ENCOUNTER — Other Ambulatory Visit: Payer: Self-pay

## 2019-04-01 ENCOUNTER — Ambulatory Visit (INDEPENDENT_AMBULATORY_CARE_PROVIDER_SITE_OTHER): Payer: 59 | Admitting: Family Medicine

## 2019-04-01 ENCOUNTER — Encounter: Payer: Self-pay | Admitting: Family Medicine

## 2019-04-01 ENCOUNTER — Other Ambulatory Visit: Payer: Self-pay | Admitting: Family Medicine

## 2019-04-01 VITALS — BP 115/78 | HR 73 | Temp 98.9°F | Resp 16 | Ht 69.0 in | Wt 203.2 lb

## 2019-04-01 DIAGNOSIS — Z124 Encounter for screening for malignant neoplasm of cervix: Secondary | ICD-10-CM

## 2019-04-01 DIAGNOSIS — Z8759 Personal history of other complications of pregnancy, childbirth and the puerperium: Secondary | ICD-10-CM | POA: Diagnosis not present

## 2019-04-01 DIAGNOSIS — Z8742 Personal history of other diseases of the female genital tract: Secondary | ICD-10-CM | POA: Diagnosis not present

## 2019-04-01 DIAGNOSIS — N939 Abnormal uterine and vaginal bleeding, unspecified: Secondary | ICD-10-CM

## 2019-04-01 DIAGNOSIS — Z0000 Encounter for general adult medical examination without abnormal findings: Secondary | ICD-10-CM

## 2019-04-01 DIAGNOSIS — G43109 Migraine with aura, not intractable, without status migrainosus: Secondary | ICD-10-CM | POA: Insufficient documentation

## 2019-04-01 MED ORDER — SUMATRIPTAN SUCCINATE 50 MG PO TABS: 25.0000 mg | ORAL_TABLET | Freq: Once | ORAL | 2 refills | Status: DC | PRN

## 2019-04-01 NOTE — Progress Notes (Signed)
Subjective:    Patient ID: [name], female    DOB: [birth year], 33 y.o.   MRN: [id number]  Martha MangesKara Elise Dickmann is a 33 y.o. female presenting on 04/01/2019 for Establish Care (migraine)  Here to establish care. She lives local. Has not been followed up regularly since daughter was born 2.5 years ago. 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 She is now due for repeat Pap smear  Admits urinary frequency during day and night-time, thinks some overactive bladder, says onset after her daughter was born, >10-15 during day and up to 3-5 overnight  Interested to return to GYN  ------------------------------------  HEADACHE, Chronic recurrent with AURA Chronic problem at younger age thought related to birth control patch but then resolved and has not had problem with migraines in >10 years. Family history - mother, she takes imitrex Location: variable, usually one side only, different areas Quality: Throbbing constant  Sense of visual aura, can tell it onset - has floaters and distorted vision prior Duration of headache without treatment: 5-8 hours with without medicine, usually less than 24 hours Current Frequency: x 1 monthly, usually in previous years had been very rare, highest amount is x 2 in 1 week Longest duration without headache: 6 to 8 weeks without this year. Accompanying symptoms: No significant associated symptoms such as nausea,  vomiting,  photophobia,  phonophobia,   lacrimation,  Rhinorrhea Effective treatment: Takes Excedrin migraine PRN some mixed results. Not taking Ibuprofen Advil Tylenol. Ineffective treatment: None Known triggers: Not associated with menstrual cycle. Unsure if provoked by stress or computer or phone screen, has a coffee daily. - Denies active headache, loss of vision, dizziness vertigo numbness tingling weakness    Health Maintenance:  02/19/2015 routine HIV screen, negative. abstracted UTD Tdap vaccine 07/2016 during pregnancy, updated chart   Depression screen PHQ 2/9 04/01/2019  Decreased Interest 0  Down, Depressed, Hopeless 0  PHQ - 2 Score 0    Past Medical History:  Diagnosis Date  . Allergy   . Anxiety   . Ectopic pregnancy   . Frequent headaches   . GERD (gastroesophageal reflux disease)   . Hypertension   . Seizures (HCC)    Past Surgical History:  Procedure Laterality Date  . CHOLECYSTECTOMY  2015  . COLPOSCOPY    . DIAGNOSTIC LAPAROSCOPY WITH REMOVAL OF ECTOPIC PREGNANCY Right 02/13/2017   Procedure: DIAGNOSTIC LAPAROSCOPY WITH REMOVAL OF ECTOPIC PREGNANCY, right salpingectomy;  Surgeon: Linzie CollinEvans, David James, MD;  Location: [facility] ORS;  Service: Gynecology;  Laterality: Right;   Social History   Socioeconomic History  . Marital status: Married    Spouse name: Not on file  . Number of children: Not on file  . Years of education: Not on file  . Highest education level: Not on file  Occupational History  . Not on file  Social Needs  . Financial resource strain: Not on file  . Food insecurity    Worry: Not on file    Inability: Not on file  .  Transportation needs    Medical: Not on file    Non-medical: Not on file  Tobacco Use  . Smoking status: Never Smoker  . Smokeless tobacco: Never Used  Substance and Sexual Activity  . Alcohol use: Yes  . Drug use: No  . Sexual activity: Yes    Birth control/protection: None  Lifestyle  . Physical activity    Days per week: Not on file    Minutes per  session: Not on file  . Stress: Not on file  Relationships  . Social Musicianconnections    Talks on phone: Not on file    Gets together: Not on file    Attends religious service: Not on file    Active member of club or organization: Not on file    Attends meetings of clubs or organizations: Not on file    Relationship status: Not on file  . Intimate partner violence    Fear of current or ex partner: Not on file    Emotionally abused: Not on file    Physically abused: Not on file    Forced sexual activity: Not on file  Other Topics Concern  . Not on file  Social History Narrative  . Not on file   Family History  Problem Relation Age of Onset  . Alcohol abuse Mother   . Alcohol abuse Father    No current outpatient medications on file prior to visit.   No current facility-administered medications on file prior to visit.     Review of Systems Per HPI unless specifically indicated above    Objective:    BP 115/78   Pulse 73   Temp 98.9 F (37.2 C) (Oral)   Resp 16   Ht 5\' 9"  (1.753 m)   Wt 203 lb 3.2 oz (92.2 kg)   BMI 30.01 kg/m   Wt Readings from Last 3 Encounters:  04/01/19 203 lb 3.2 oz (92.2 kg)  10/26/17 200 lb (90.7 kg)  02/13/17 243 lb (110.2 kg)    Physical Exam Vitals signs and nursing note reviewed.  Constitutional:      General: She is not in acute distress.    Appearance: She is well-developed. She is not diaphoretic.     Comments: Well-appearing, comfortable, cooperative  HENT:     Head: Normocephalic and atraumatic.  Eyes:     General:        Right eye: No discharge.        Left eye: No discharge.     Conjunctiva/sclera: Conjunctivae normal.  Cardiovascular:     Rate and Rhythm: Normal rate.  Pulmonary:     Effort: Pulmonary effort is normal.  Skin:    General: Skin is warm and dry.     Findings: No erythema or rash.  Neurological:     Mental Status: She is alert and oriented to person, place, and time.  Psychiatric:        Behavior:  Behavior normal.     Comments: Well groomed, good eye contact, normal speech and thoughts    Results for orders placed or performed in visit on 04/01/19  HM HIV SCREENING LAB  Result Value Ref Range   HM HIV Screening Negative - Validated       Assessment & Plan:   Problem List Items Addressed This Visit    Chronic migraine with aura    Increasing frequency chronic migraine w/ aura without complication No active headache today Only OTC excedrin treatment, no other therapy tried in past  Plan: 1. Start abortive therapy with Sumatriptan 50mg  tabs can take HALF dose for 25-50 PRN (#12, 0 refill due to quantity limit), severe HA, may repeat dose within 2 hr if persistent, no more in 24 hours, in future can titrate dose to if needed. Counseling on potential side effect / intolerance with chest discomfort acutely after taking sumatriptan 2. Recommend taking Excedrin Migraine vs Ibuprofen 600-800mg  q 8 hr PRN, and can try Tylenol 1000mg  TID alternatively 3. Avoid triggers including foods, caffeine. Important to rest. - Discussion on future migraine prophylaxis medications - handout given, review options at next visit, determine need if using sumatriptan frequently 4. Start headache diary, handout given, identify triggers for avoidance, bring to next visit 5. 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    Meds ordered this encounter  Medications  . SUMAtriptan (IMITREX) 50 MG tablet    Sig: Take 0.5-1 tablets (25-50 mg total) by mouth once as needed for migraine. May repeat one dose in 2 hours if headache persists, for max dose 24 hours    Dispense:  12 tablet    Refill:  2   Orders Placed This Encounter  Procedures  . [facility] HIV SCREENING LAB    This external order was created through the Results Console.  . Ambulatory referral to Obstetrics / Gynecology    Referral Priority:   Routine    Referral Type:   Consultation    Referral Reason:   Specialty Services Required    Requested Specialty:   Obstetrics and Gynecology    Number of Visits Requested:   1    Follow up plan: Return in about 6 weeks (around 05/13/2019) for Annual Physical.   Future labs ordered 05/08/19 - CMET A1c, CBC, Lipid, TSH    Nobie Putnam, DO [facility] 04/01/2019, 10:29 AM

## 2019-04-01 NOTE — Patient Instructions (Addendum)
Thank you for coming to the office today.  Stay tuned for apt - to discuss irregular menstrual bleeding recently, also routine pap smear, with your history of prior abnormal and the ectopic if needed  [facility] [address] [place], [place] [address] Hours: [place] Main: [place]   Address: [address], [place], [place], [place], [place] [address] Hours: 8AM-5PM Phone: [phone]  Ask GYN as well about overactive bladder - possibly pelvic floor dysfunction from pregnancy, they can consider rehab or we can refer to Urologist  ---------------------------------------------  1. You most likely have Chronic Migraine Headaches - Migraine headaches present differently for many patients, pain is usually throbbing or aching, often on one side of head or behind the eye. They tend to last for up to hours or days. In treating migraines, our goal is to 1) stop the headache and 2) prevent recurrence of headaches  Treatment to STOP the headache at this time: - Start with Sumatriptan 50mg  - take HALF OR ONE WHOLE immediately at onset of moderate to severe migraine headache, if unresolved or return within 2 hours then repeat dose 1 tablet, that is max dose for 24 hours. (Note - this medication can cause a brief episode of flushing and chest pressure or pain very soon after taking it. That is NORMAL, and it is the medicine taking effect and dilating some blood vessels. It should pass, and resolve within seconds to minutes after - it may not happen at all)  - Try this for 1-2 weeks, if absolutely NOT helping then contact me to discuss changes, possibly can double sumatriptan dose or try nasal spray - You can still take over the counter meds with Ibuprofen up to 600-800mg  per dose 3 times a day with food for a few days and may try Excedrin Migraine as needed, ONLY use these after you have tried Sumatriptan up to 2 doses, and try to limit their  use if they are not effective  Treatment to PREVENT headaches: - Goal is to avoid triggers. We need to learn more details on what are your exact or possible headache triggers first. - Keep detailed headache diary (on printed handout) for possible triggers, bring this to your next visit to discuss further - Known possible triggers include caffeine, chocolate, alcohol, stress, weather changes, menstrual cycle, certain other foods - Also be aware that OTC pain meds/anti-inflammatories can cause rebound headache, they help resolve the headache but then after the effect wears off they can CAUSE a headache. Try to taper down and stop these medications and allow them to get out of your system for 1-2 weeks   Look into some of these alternatives for Migraine Headache Prophylaxis or preventative treatment  Antidepressant Type - Venlafaxine, Amitriptyline Blood pressure meds - Metoprolol, Propanolol, Verapamil Anti Headache/Seizure/Nerve medications - Topamax, Gabapentin  Future medication options that can be considered if persistent severe migraines and if failed some of the above prevention medicines include: - Aimovig, Emgality  (these are once a month self injection medications that work directly to block migraine signals. They are relatively new, can be costly and have been proven to be very safe and effective. We can try to get them approved or for low cost if needed in future.)  DUE for FASTING BLOOD WORK (no food or drink after midnight before the lab appointment, only water or coffee without cream/sugar on the morning of)  SCHEDULE "Lab Only" visit in the morning at  the clinic for lab draw in 4-6 weeks  - Make sure Lab Only appointment is at about 1 week before your next appointment, so that results will be available  For Lab Results, once available within 2-3 days of blood draw, you can can log in to MyChart online to view your results and a brief explanation. Also, we can discuss results at  next follow-up visit.   Please schedule a Follow-up Appointment to: Return in about 6 weeks (around 05/13/2019) for Annual Physical.  If you have any other questions or concerns, please feel free to call the office or send a message through MyChart. You may also schedule an earlier appointment if necessary.  Additionally, you may be receiving a survey about your experience at our office within a few days to 1 week by e-mail or mail. We value your feedback.  [name] , [name] [facility]

## 2019-04-01 NOTE — Assessment & Plan Note (Signed)
Increasing frequency chronic migraine w/ aura without complication No active headache today Only OTC excedrin treatment, no other therapy tried in past  Plan: 1. Start abortive therapy with Sumatriptan 50mg  tabs can take HALF dose for 25-50 PRN (#12, 0 refill due to quantity limit), severe HA, may repeat dose within 2 hr if persistent, no more in 24 hours, in future can titrate dose to if needed. Counseling on potential side effect / intolerance with chest discomfort acutely after taking sumatriptan 2. Recommend taking Excedrin Migraine vs Ibuprofen 600-800mg  q 8 hr PRN, and can try Tylenol 1000mg  TID alternatively 3. Avoid triggers including foods, caffeine. Important to rest. - Discussion on future migraine prophylaxis medications - handout given, review options at next visit, determine need if using sumatriptan frequently 4. Start headache diary, handout given, identify triggers for avoidance, bring to next visit 5. Return criteria given for acute migraine, when to go to office vs ED

## 2019-04-01 NOTE — Assessment & Plan Note (Signed)
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

## 2019-04-17 ENCOUNTER — Ambulatory Visit (INDEPENDENT_AMBULATORY_CARE_PROVIDER_SITE_OTHER): Payer: 59 | Admitting: Obstetrics and Gynecology

## 2019-04-17 ENCOUNTER — Encounter: Payer: Self-pay | Admitting: Obstetrics and Gynecology

## 2019-04-17 ENCOUNTER — Other Ambulatory Visit (HOSPITAL_COMMUNITY)
Admission: RE | Admit: 2019-04-17 | Discharge: 2019-04-17 | Disposition: A | Discharge status: Home / Self Care | Payer: 59 | Source: Ambulatory Visit | Attending: Obstetrics and Gynecology | Admitting: Obstetrics and Gynecology

## 2019-04-17 ENCOUNTER — Other Ambulatory Visit: Payer: Self-pay

## 2019-04-17 VITALS — BP 108/68 | Ht 69.0 in | Wt 203.0 lb

## 2019-04-17 DIAGNOSIS — Z0000 Encounter for general adult medical examination without abnormal findings: Secondary | ICD-10-CM

## 2019-04-17 DIAGNOSIS — Z124 Encounter for screening for malignant neoplasm of cervix: Secondary | ICD-10-CM | POA: Diagnosis present

## 2019-04-17 NOTE — Progress Notes (Signed)
Patient ID: [name], female   DOB: 12-02-1985, 33 y.o.   MRN: [id number]  Reason for Consult: Gynecologic Exam (Hot flashes, and frequent urination x3 years)   Referred by Saralyn PilarKaramalegos, Alexander *  Subjective:     HPI:  [name] is a 33 y.o. female . She is here today for an pap smear and to establish GYN care.   She reports that in February and March of 2020 she had heavy menstrual bleeding and pssage of tissue. She is unsure of the reason for this bleeding. She reports since then she has had a normal monthly period.  Additionally she reports that she frequently urinates. "I pee every hour."   Gynecological History LMP: 03/29/2019 Describes periods as lasting 3-4 days with 1-2 days heavy bleeding the lighter.  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   Past Medical History:  Diagnosis Date  . Allergy   . Anxiety   . Ectopic pregnancy   . Frequent headaches   . GERD (gastroesophageal reflux disease)   . Hypertension   . Seizures (HCC)    Family History  Problem Relation Age of Onset  . Alcohol abuse Mother   . Alcohol abuse Father    Past Surgical History:  Procedure Laterality Date  . CHOLECYSTECTOMY  2015  . COLPOSCOPY    . DIAGNOSTIC LAPAROSCOPY WITH REMOVAL OF ECTOPIC PREGNANCY Right 02/13/2017   Procedure: DIAGNOSTIC LAPAROSCOPY WITH REMOVAL OF ECTOPIC PREGNANCY, right salpingectomy;  Surgeon: Linzie CollinEvans, David James, MD;  Location: [facility] ORS;  Service: Gynecology;  Laterality: Right;    Short Social History:  Social History   Tobacco Use  . Smoking status: Never Smoker  . Smokeless tobacco: Never Used  Substance Use Topics  . Alcohol use: Yes    Comment: occasionally twice a week     Allergies  Allergen Reactions  . Bupropion Other (See Comments)    Causes seizures. Seizure     Current Outpatient Medications  Medication Sig Dispense Refill  . SUMAtriptan (IMITREX) 50 MG tablet Take 0.5-1 tablets (25-50 mg total) by mouth once as needed for migraine. May repeat one dose in 2 hours if headache persists, for max dose 24 hours 12 tablet 2   No current facility-administered medications for this visit.     Review of Systems  Constitutional: Negative for chills, fatigue, fever and unexpected weight change.  HENT: Negative for trouble swallowing.  Eyes: Negative for loss of vision.  Respiratory: Negative for cough, shortness of breath and wheezing.  Cardiovascular: Negative for chest pain, leg swelling, palpitations and syncope.  GI: Negative for abdominal pain, blood in stool, diarrhea, nausea and vomiting.  GU: Negative for difficulty urinating, dysuria, frequency and hematuria.  Musculoskeletal: Negative for back pain, leg pain and joint pain.  Skin: Negative for rash.  Neurological: Negative for dizziness, headaches, light-headedness, numbness and seizures.  Psychiatric: Negative for behavioral problem, confusion, depressed mood and sleep disturbance.        Objective:  Objective   Vitals:   04/17/19 0833  BP: 108/68  Weight: 203 lb (92.1 kg)  Height: 5\' 9"  (1.753 m)   Body mass index is 29.98 kg/m.  Physical Exam Vitals signs and nursing note reviewed.  Constitutional:      Appearance: She is  well-developed.  HENT:     Head: Normocephalic and atraumatic.  Eyes:     Pupils: Pupils are equal, round, and reactive to light.  Cardiovascular:     Rate and Rhythm: Normal rate and regular rhythm.  Pulmonary:     Effort: Pulmonary effort is normal. No respiratory distress.  Genitourinary:    General: Normal vulva.     Comments: External: Normal appearing vulva. No lesions noted.  Speculum examination: Normal appearing cervix. No blood in the  vaginal vault. no discharge.  Bimanual examination: Uterus midline, non-tender, normal in size, shape and contour.  No CMT. No adnexal masses. No adnexal tenderness. Pelvis not fixed.    Skin:    General: Skin is warm and dry.  Neurological:     Mental Status: She is alert and oriented to person, place, and time.  Psychiatric:        Behavior: Behavior normal.        Thought Content: Thought content normal.        Judgment: Judgment normal.        Assessment/Plan:     34 yo G2P1 1. Frequent urination- Will follow up for a bladder diary 2. Cervical Cancer Screening: Pap today 3. Episodic abnormal uterine bleeding- No birth control desired. Will follow up if she has any additional problems with her menstruation 4. STD screening offered and accepted. Declines blood work, would like gonorrhea, chlamydia, trichomoniasis.   More than 45 minutes were spent face to face with the patient in the room with more than 50% of the time spent providing counseling and discussing the plan of management.   Adrian Prows MD [facility], [facility] 04/17/2019 9:10 AM

## 2019-04-23 LAB — CYTOLOGY - PAP
Chlamydia: NEGATIVE
Diagnosis: NEGATIVE
HPV 16/18/45 genotyping: NEGATIVE
HPV: DETECTED — AB
Neisseria Gonorrhea: NEGATIVE
Trichomonas: NEGATIVE

## 2019-05-05 ENCOUNTER — Ambulatory Visit: Payer: 59 | Admitting: Obstetrics and Gynecology

## 2019-05-08 ENCOUNTER — Other Ambulatory Visit: Payer: 59

## 2019-05-08 ENCOUNTER — Other Ambulatory Visit: Payer: Self-pay

## 2019-05-08 DIAGNOSIS — Z0000 Encounter for general adult medical examination without abnormal findings: Secondary | ICD-10-CM

## 2019-05-09 LAB — CBC WITH DIFFERENTIAL/PLATELET
Absolute Monocytes: 259 cells/uL (ref 200–950)
Basophils Absolute: 19 cells/uL (ref 0–200)
Basophils Relative: 0.4 %
Eosinophils Absolute: 82 cells/uL (ref 15–500)
Eosinophils Relative: 1.7 %
HCT: 42.6 % (ref 35.0–45.0)
Hemoglobin: 14 g/dL (ref 11.7–15.5)
Lymphs Abs: 1618 cells/uL (ref 850–3900)
MCH: 31.3 pg (ref 27.0–33.0)
MCHC: 32.9 g/dL (ref 32.0–36.0)
MCV: 95.3 fL (ref 80.0–100.0)
MPV: 10.3 fL (ref 7.5–12.5)
Monocytes Relative: 5.4 %
Neutro Abs: 2822 cells/uL (ref 1500–7800)
Neutrophils Relative %: 58.8 %
Platelets: 207 10*3/uL (ref 140–400)
RBC: 4.47 10*6/uL (ref 3.80–5.10)
RDW: 11.4 % (ref 11.0–15.0)
Total Lymphocyte: 33.7 %
WBC: 4.8 10*3/uL (ref 3.8–10.8)

## 2019-05-09 LAB — COMPLETE METABOLIC PANEL WITH GFR
AG Ratio: 1.8 (calc) (ref 1.0–2.5)
ALT: 16 U/L (ref 6–29)
AST: 19 U/L (ref 10–30)
Albumin: 4.3 g/dL (ref 3.6–5.1)
Alkaline phosphatase (APISO): 51 U/L (ref 31–125)
BUN: 12 mg/dL (ref 7–25)
CO2: 25 mmol/L (ref 20–32)
Calcium: 9.5 mg/dL (ref 8.6–10.2)
Chloride: 105 mmol/L (ref 98–110)
Creat: 0.7 mg/dL (ref 0.50–1.10)
GFR, Est African American: 131 mL/min/{1.73_m2} (ref >=60)
GFR, Est Non African American: 113 mL/min/{1.73_m2} (ref >=60)
Globulin: 2.4 g/dL (calc) (ref 1.9–3.7)
Glucose, Bld: 116 mg/dL — ABNORMAL HIGH (ref 65–99)
Potassium: 4.1 mmol/L (ref 3.5–5.3)
Sodium: 137 mmol/L (ref 135–146)
Total Bilirubin: 0.8 mg/dL (ref 0.2–1.2)
Total Protein: 6.7 g/dL (ref 6.1–8.1)

## 2019-05-09 LAB — LIPID PANEL
Cholesterol: 150 mg/dL (ref <200)
HDL: 70 mg/dL (ref >=50)
LDL Cholesterol (Calc): 63 mg/dL (calc)
Non-HDL Cholesterol (Calc): 80 mg/dL (calc) (ref <130)
Total CHOL/HDL Ratio: 2.1 (calc) (ref <5.0)
Triglycerides: 88 mg/dL (ref <150)

## 2019-05-09 LAB — HEMOGLOBIN A1C
Hgb A1c MFr Bld: 4.5 % of total Hgb (ref <5.7)
Mean Plasma Glucose: 82 (calc)
eAG (mmol/L): 4.6 (calc)

## 2019-05-09 LAB — TSH: TSH: 0.78 mIU/L

## 2019-05-13 ENCOUNTER — Encounter: Payer: Self-pay | Admitting: Family Medicine

## 2019-05-13 ENCOUNTER — Other Ambulatory Visit: Payer: Self-pay

## 2019-05-13 ENCOUNTER — Ambulatory Visit (INDEPENDENT_AMBULATORY_CARE_PROVIDER_SITE_OTHER): Payer: 59 | Admitting: Family Medicine

## 2019-05-13 VITALS — BP 122/76 | HR 64 | Temp 98.9°F | Resp 18 | Ht 69.0 in | Wt 208.8 lb

## 2019-05-13 DIAGNOSIS — Z0000 Encounter for general adult medical examination without abnormal findings: Secondary | ICD-10-CM

## 2019-05-13 NOTE — Progress Notes (Signed)
Subjective:    Patient ID: [name], female    DOB: 05-Mar-1986, 33 y.o.   MRN: [id number]  Alberto Dong is a 33 y.o. female presenting on 05/13/2019 for Annual Exam (Physical)   HPI   Here for Annual Physical and Lab Review.  Wellness / Lifestyle Weight gain up to 5 lbs Diet - Reduced calorie count, 1000 to 1500 daily sometimes, trying to reduce portions, healthy diet mostly, still some sweets Exercise - walking 15-20 min walk, several days a week, some youtube exercise videos, interested in yoga  OBGYN History Prior pregnancy with daughter born 2.5 years ago Ectopic pregnancy in 2018  Menorrhagia Followed by [facility], has improved menstrual cycle now. Pap smear   Recent issue with heavy bleeding in Jan - March 2020, heavy bleeding, with large clots and possible tissue, irregular period cycle in between cycles Attributed some of this to stress at the time, relationship, etc Admits urinary frequency during day and night-time, thinks some overactive bladder, says onset after her daughter was born, >10-15 during day and up to 3-5 overnight  ------------------------------------  HEADACHE, Chronic recurrent with AURA Follow-up last visit trial sumatriptan Now has done well without migraine.   Health Maintenance:  Cervical Cancer Screening: Last Pap smear done by GYN 04/17/19 - POSITIVE HPV but negative High risk HPV, no prior gardisil vaccine, she has negative pap smear cytology.  UTD Tdap vaccine 07/2016 during pregnancy, updated chart  Due for Flu Shot, defer today to get within 1 month   Depression screen PHQ 2/9 04/01/2019  Decreased Interest 0  Down, Depressed, Hopeless 0  PHQ - 2 Score 0    Past Medical History:  Diagnosis Date  . Allergy   . Anxiety   . Ectopic pregnancy   . Frequent headaches   . GERD (gastroesophageal reflux disease)   . Hypertension   . Seizures (HCC)    Past Surgical History:  Procedure Laterality Date  .  CHOLECYSTECTOMY  2015  . COLPOSCOPY    . DIAGNOSTIC LAPAROSCOPY WITH REMOVAL OF ECTOPIC PREGNANCY Right 02/13/2017   Procedure: DIAGNOSTIC LAPAROSCOPY WITH REMOVAL OF ECTOPIC PREGNANCY, right salpingectomy;  Surgeon: Linzie Collin, MD;  Location: [facility] ORS;  Service: Gynecology;  Laterality: Right;   Social History   Socioeconomic History  . Marital status: Legally Separated    Spouse name: Not on file  . Number of children: Not on file  . Years of education: Not on file  . Highest education level: Not on file  Occupational History  . Not on file  Social Needs  . Financial resource strain: Not on file  . Food insecurity    Worry: Not on file    Inability: Not on file  . Transportation needs    Medical: Not on file    Non-medical: Not on file  Tobacco Use  . Smoking status: Never Smoker  . Smokeless tobacco: Never Used  Substance and Sexual Activity  . Alcohol use: Yes    Comment: occasionally twice a week  . Drug use: No  . Sexual activity: Yes    Birth control/protection: Surgical    Comment: vasectomy   Lifestyle  . Physical activity    Days per week: Not on file    Minutes per session: Not on file  . Stress: Not on file  Relationships  . Social Musician on phone: Not on file    Gets together: Not on file    Attends religious service:  Not on file    Active member of club or organization: Not on file    Attends meetings of clubs or organizations: Not on file    Relationship status: Not on file  . Intimate partner violence    Fear of current or ex partner: Not on file    Emotionally abused: Not on file    Physically abused: Not on file    Forced sexual activity: Not on file  Other Topics Concern  . Not on file  Social History Narrative  . Not on file   Family History  Problem Relation Age of Onset  . Alcohol abuse Mother   . Alcohol abuse Father    Current Outpatient Medications on File Prior to Visit  Medication Sig  . SUMAtriptan  (IMITREX) 50 MG tablet Take 0.5-1 tablets (25-50 mg total) by mouth once as needed for migraine. May repeat one dose in 2 hours if headache persists, for max dose 24 hours   No current facility-administered medications on file prior to visit.     Review of Systems  Constitutional: Negative for activity change, appetite change, chills, diaphoresis, fatigue and fever.  HENT: Negative for congestion and hearing loss.   Eyes: Negative for visual disturbance.  Respiratory: Negative for cough, chest tightness, shortness of breath and wheezing.   Cardiovascular: Negative for chest pain, palpitations and leg swelling.  Gastrointestinal: Negative for abdominal pain, constipation, diarrhea, nausea and vomiting.  Genitourinary: Negative for dysuria, frequency and hematuria.  Musculoskeletal: Negative for arthralgias and neck pain.  Skin: Negative for rash.  Neurological: Negative for dizziness, weakness, light-headedness, numbness and headaches.  Hematological: Negative for adenopathy.  Psychiatric/Behavioral: Negative for behavioral problems, dysphoric mood and sleep disturbance.   Per HPI unless specifically indicated above     Objective:    BP 122/76 (BP Location: Left Arm, Cuff Size: Normal)   Pulse 64   Temp 98.9 F (37.2 C) (Oral)   Resp 18   Ht 5\' 9"  (1.753 m)   Wt 208 lb 12.8 oz (94.7 kg)   SpO2 100%   BMI 30.83 kg/m   Wt Readings from Last 3 Encounters:  05/13/19 208 lb 12.8 oz (94.7 kg)  04/17/19 203 lb (92.1 kg)  04/01/19 203 lb 3.2 oz (92.2 kg)    Physical Exam Vitals signs and nursing note reviewed.  Constitutional:      General: She is not in acute distress.    Appearance: She is well-developed. She is not diaphoretic.     Comments: Well-appearing, comfortable, cooperative  HENT:     Head: Normocephalic and atraumatic.  Eyes:     General:        Right eye: No discharge.        Left eye: No discharge.     Conjunctiva/sclera: Conjunctivae normal.     Pupils:  Pupils are equal, round, and reactive to light.  Neck:     Musculoskeletal: Normal range of motion and neck supple.     Thyroid: No thyromegaly.     Comments: No thyromegaly Cardiovascular:     Rate and Rhythm: Normal rate and regular rhythm.     Heart sounds: Normal heart sounds. No murmur.  Pulmonary:     Effort: Pulmonary effort is normal. No respiratory distress.     Breath sounds: Normal breath sounds. No wheezing or rales.  Abdominal:     General: Bowel sounds are normal. There is no distension.     Palpations: Abdomen is soft. There is no mass.  Tenderness: There is no abdominal tenderness.  Musculoskeletal: Normal range of motion.        General: No tenderness.     Comments: Upper / Lower Extremities: - Normal muscle tone, strength bilateral upper extremities 5/5, lower extremities 5/5  Lymphadenopathy:     Cervical: No cervical adenopathy.  Skin:    General: Skin is warm and dry.     Findings: No erythema or rash.  Neurological:     Mental Status: She is alert and oriented to person, place, and time.     Comments: Distal sensation intact to light touch all extremities  Psychiatric:        Behavior: Behavior normal.     Comments: Well groomed, good eye contact, normal speech and thoughts      I have personally reviewed the pathology from 04/17/19  Contains abnormal data Cytology - PAP Order: [id number] Status:  Edited Result - FINAL Visible to patient:  Yes (MyChart) Next appt:  None Dx:  Cervical cancer screening Component 3wk ago  Adequacy Satisfactory for evaluation endocervical/transformation zone component PRESENT.   Diagnosis NEGATIVE FOR INTRAEPITHELIAL LESIONS OR MALIGNANCY. BENIGN REACTIVE/REPARATIVE CHANGES.   Chlamydia Negative   Comment: Normal Reference Range - Negative  Neisseria gonorrhea Negative   Comment: Normal Reference Range - Negative  HPV DETECTED Abnormal    Comment: Normal Reference Range - NOT Detected  HPV 16/18/45 genotyping  NEGATIVE for HPV 16 & 18/45  Comment: Normal Reference Range - Negative  Trichomonas Negative   Comment: Normal Reference Range - Negative  Material Submitted CervicoVaginal Pap [ThinPrep Imaged]   CYTOLOGY - PAP PAP RESULT           Results for orders placed or performed in visit on 05/08/19  TSH  Result Value Ref Range   TSH 0.78 mIU/L  Lipid panel  Result Value Ref Range   Cholesterol 150 <200 mg/dL   HDL 70 > OR = 50 mg/dL   Triglycerides 88 <409<150 mg/dL   LDL Cholesterol (Calc) 63 mg/dL (calc)   Total CHOL/HDL Ratio 2.1 <5.0 (calc)   Non-HDL Cholesterol (Calc) 80 <811<130 mg/dL (calc)  COMPLETE METABOLIC PANEL WITH GFR  Result Value Ref Range   Glucose, Bld 116 (H) 65 - 99 mg/dL   BUN 12 7 - 25 mg/dL   Creat 9.140.70 7.820.50 - 9.561.10 mg/dL   GFR, Est Non African American 113 > OR = 60 mL/min/1.5073m2   GFR, Est African American 131 > OR = 60 mL/min/1.2773m2   BUN/Creatinine Ratio NOT APPLICABLE 6 - 22 (calc)   Sodium 137 135 - 146 mmol/L   Potassium 4.1 3.5 - 5.3 mmol/L   Chloride 105 98 - 110 mmol/L   CO2 25 20 - 32 mmol/L   Calcium 9.5 8.6 - 10.2 mg/dL   Total Protein 6.7 6.1 - 8.1 g/dL   Albumin 4.3 3.6 - 5.1 g/dL   Globulin 2.4 1.9 - 3.7 g/dL (calc)   AG Ratio 1.8 1.0 - 2.5 (calc)   Total Bilirubin 0.8 0.2 - 1.2 mg/dL   Alkaline phosphatase (APISO) 51 31 - 125 U/L   AST 19 10 - 30 U/L   ALT 16 6 - 29 U/L  CBC with Differential/Platelet  Result Value Ref Range   WBC 4.8 3.8 - 10.8 Thousand/uL   RBC 4.47 3.80 - 5.10 Million/uL   Hemoglobin 14.0 11.7 - 15.5 g/dL   HCT 21.342.6 08.635.0 - 57.845.0 %   MCV 95.3 80.0 - 100.0 fL   MCH 31.3 27.0 -  33.0 pg   MCHC 32.9 32.0 - 36.0 g/dL   RDW 46.911.4 62.911.0 - 52.815.0 %   Platelets 207 140 - 400 Thousand/uL   MPV 10.3 7.5 - 12.5 fL   Neutro Abs 2,822 1,500 - 7,800 cells/uL   Lymphs Abs 1,618 850 - 3,900 cells/uL   Absolute Monocytes 259 200 - 950 cells/uL   Eosinophils Absolute 82 15 - 500 cells/uL   Basophils Absolute 19 0 - 200 cells/uL    Neutrophils Relative % 58.8 %   Total Lymphocyte 33.7 %   Monocytes Relative 5.4 %   Eosinophils Relative 1.7 %   Basophils Relative 0.4 %  Hemoglobin A1c  Result Value Ref Range   Hgb A1c MFr Bld 4.5 <5.7 % of total Hgb   Mean Plasma Glucose 82 (calc)   eAG (mmol/L) 4.6 (calc)      Assessment & Plan:   Problem List Items Addressed This Visit    None    Visit Diagnoses    Annual physical exam    -  Primary      Updated Health Maintenance information - return for flu shot Reviewed recent lab results with patient Encouraged improvement to lifestyle with diet and exercise - Goal of weight loss   No orders of the defined types were placed in this encounter.   Follow up plan: Return in about 1 year (around 05/12/2020) for Annual Physical.  Future labs ordered.  Saralyn PilarAlexander Karamalegos, DO [facility] 05/13/2019, 9:14 AM

## 2019-05-13 NOTE — Patient Instructions (Addendum)
Thank you for coming to the office today.  Overall blood sugar fasting was only slightly elevated but this is NORMAL if you were not fasting. It should be < 129 if not fasting it was 116  Your A1c was 4.5, this is completely normal, no where near pre-diabetes  Keep improving lifestyle diet exercise to maintain healthy weight going forward  -----------------------------------------------------------  Consider Gardisil for high risk HPV prevention  Prevention of human papillomavirus infection: IM: Adults: Females ?34 years of age and males ?34 years of age: Three-dose series: 0.5 mL at 0, 2, and 6 months  ------------------------------  DUE for FASTING BLOOD WORK (no food or drink after midnight before the lab appointment, only water or coffee without cream/sugar on the morning of)  SCHEDULE "Lab Only" visit in the morning at the clinic for lab draw in 1 YEAR  - Make sure Lab Only appointment is at about 1 week before your next appointment, so that results will be available  For Lab Results, once available within 2-3 days of blood draw, you can can log in to MyChart online to view your results and a brief explanation. Also, we can discuss results at next follow-up visit.    Please schedule a Follow-up Appointment to: Return in about 1 year (around 05/12/2020) for Annual Physical.  If you have any other questions or concerns, please feel free to call the office or send a message through [facility]. You may also schedule an earlier appointment if necessary.  Additionally, you may be receiving a survey about your experience at our office within a few days to 1 week by e-mail or mail. We value your feedback.  Nobie Putnam, DO [place]

## 2019-05-14 ENCOUNTER — Other Ambulatory Visit: Payer: Self-pay | Admitting: Family Medicine

## 2019-05-14 DIAGNOSIS — Z0000 Encounter for general adult medical examination without abnormal findings: Secondary | ICD-10-CM

## 2019-06-03 ENCOUNTER — Ambulatory Visit (INDEPENDENT_AMBULATORY_CARE_PROVIDER_SITE_OTHER): Payer: 59

## 2019-06-03 ENCOUNTER — Other Ambulatory Visit: Payer: Self-pay

## 2019-06-03 DIAGNOSIS — Z23 Encounter for immunization: Secondary | ICD-10-CM | POA: Diagnosis not present

## 2020-05-26 ENCOUNTER — Other Ambulatory Visit: Payer: Self-pay

## 2020-05-26 ENCOUNTER — Ambulatory Visit
Admission: EM | Admit: 2020-05-26 | Discharge: 2020-05-26 | Disposition: A | Discharge status: Home / Self Care | Payer: 59 | Attending: Emergency Medicine | Admitting: Emergency Medicine

## 2020-05-26 DIAGNOSIS — J020 Streptococcal pharyngitis: Secondary | ICD-10-CM | POA: Diagnosis not present

## 2020-05-26 DIAGNOSIS — B349 Viral infection, unspecified: Secondary | ICD-10-CM

## 2020-05-26 LAB — POCT RAPID STREP A (OFFICE): Rapid Strep A Screen: POSITIVE — AB

## 2020-05-26 LAB — POC SARS CORONAVIRUS 2 AG -  ED: SARS Coronavirus 2 Ag: NEGATIVE

## 2020-05-26 MED ORDER — PENICILLIN G BENZATHINE 1200000 UNIT/2ML IM SUSP
1.2000 10*6.[IU] | Freq: Once | INTRAMUSCULAR | Status: AC
  Administered 2020-05-26: 1.2 10*6.[IU] via INTRAMUSCULAR

## 2020-05-26 MED ORDER — ACETAMINOPHEN 325 MG PO TABS
650.0000 mg | ORAL_TABLET | Freq: Once | ORAL | Status: AC
  Administered 2020-05-26: 650 mg via ORAL

## 2020-05-26 NOTE — ED Triage Notes (Signed)
C/o body aches, chills, congestion, and sore throat since yesterday. Patient agreeable to COVID testing.

## 2020-05-26 NOTE — ED Provider Notes (Signed)
[name]    CSN: [id number] Arrival date & time: 05/26/20  0805      History   Chief Complaint Chief Complaint  Patient presents with  . Generalized Body Aches  . Chills  . Nasal Congestion  . Sore Throat    HPI [name] is a 34 y.o. female.   Patient presents with 1 day history of fever, chills, body aches, nasal congestion, sore throat, and nonproductive cough. She denies shortness of breath, vomiting, diarrhea, rash, or other symptoms. No treatment attempted at home.  Medical history includes anxiety, hypertension, GERD, seizures, headaches. No known sick contacts.  The history is provided by the patient.    Past Medical History:  Diagnosis Date  . Allergy   . Anxiety   . Ectopic pregnancy   . Frequent headaches   . GERD (gastroesophageal reflux disease)   . Hypertension   . Seizures [facility])     Patient Active Problem List   Diagnosis Date Noted  . History of abnormal cervical Pap smear 04/01/2019  . History of ectopic pregnancy 04/01/2019  . Chronic migraine with aura 04/01/2019    Past Surgical History:  Procedure Laterality Date  . CHOLECYSTECTOMY  2015  . COLPOSCOPY    . DIAGNOSTIC LAPAROSCOPY WITH REMOVAL OF ECTOPIC PREGNANCY Right 02/13/2017   Procedure: DIAGNOSTIC LAPAROSCOPY WITH REMOVAL OF ECTOPIC PREGNANCY, right salpingectomy;  Surgeon: Linzie Collin, MD;  Location: [facility] ORS;  Service: Gynecology;  Laterality: Right;    OB History    Gravida  2   Para  1   Term  1   Preterm  0   AB  1   Living  1     SAB  0   TAB  0   Ectopic  1   Multiple  0   Live Births  1            Home Medications    Prior to Admission medications   Medication Sig Start Date End Date Taking? Authorizing Provider  SUMAtriptan (IMITREX) 50 MG tablet Take 0.5-1 tablets (25-50 mg total) by mouth once as needed for migraine. May repeat one dose in 2 hours if headache persists, for max dose 24 hours 04/01/19   Karamalegos,  Netta Neat, DO    Family History Family History  Problem Relation Age of Onset  . Alcohol abuse Mother   . Alcohol abuse Father     Social History Social History   Tobacco Use  . Smoking status: Never Smoker  . Smokeless tobacco: Never Used  Vaping Use  . Vaping Use: Never used  Substance Use Topics  . Alcohol use: Yes    Comment: occasionally twice a week  . Drug use: No     Allergies   Bupropion   Review of Systems Review of Systems  Constitutional: Positive for chills, fatigue and fever.  HENT: Positive for congestion and sore throat. Negative for ear pain.   Eyes: Negative for pain and visual disturbance.  Respiratory: Positive for cough. Negative for shortness of breath.   Cardiovascular: Negative for chest pain and palpitations.  Gastrointestinal: Negative for abdominal pain, diarrhea and vomiting.  Genitourinary: Negative for dysuria and hematuria.  Musculoskeletal: Negative for arthralgias and back pain.  Skin: Negative for color change and rash.  Neurological: Negative for seizures and syncope.  All other systems reviewed and are negative.    Physical Exam Triage Vital Signs ED Triage Vitals  Enc Vitals Group     BP  Pulse      Resp      Temp      Temp src      SpO2      Weight      Height      Head Circumference      Peak Flow      Pain Score      Pain Loc      Pain Edu?      Excl. in GC?    No data found.  Updated Vital Signs BP 105/72   Pulse (!) 117   Temp (!) 102 F (38.9 C)   Resp 20   LMP 05/12/2020 (Within Days)   SpO2 97%   Visual Acuity Right Eye Distance:   Left Eye Distance:   Bilateral Distance:    Right Eye Near:   Left Eye Near:    Bilateral Near:     Physical Exam Vitals and nursing note reviewed.  Constitutional:      General: She is not in acute distress.    Appearance: She is well-developed. She is ill-appearing.  HENT:     Head: Normocephalic and atraumatic.     Right Ear: Tympanic membrane  normal.     Left Ear: Tympanic membrane normal.     Nose: Nose normal.     Mouth/Throat:     Mouth: Mucous membranes are moist.     Pharynx: Oropharyngeal exudate and posterior oropharyngeal erythema present.  Eyes:     Conjunctiva/sclera: Conjunctivae normal.  Cardiovascular:     Rate and Rhythm: Normal rate and regular rhythm.     Heart sounds: No murmur heard.   Pulmonary:     Effort: Pulmonary effort is normal. No respiratory distress.     Breath sounds: Normal breath sounds.  Abdominal:     Palpations: Abdomen is soft.     Tenderness: There is no abdominal tenderness. There is no guarding or rebound.  Musculoskeletal:     Cervical back: Neck supple.  Skin:    General: Skin is warm and dry.     Findings: No rash.  Neurological:     General: No focal deficit present.     Mental Status: She is alert and oriented to person, place, and time.     Gait: Gait normal.  Psychiatric:        Mood and Affect: Mood normal.        Behavior: Behavior normal.      UC Treatments / Results  Labs (all labs ordered are listed, but only abnormal results are displayed) Labs Reviewed  POCT RAPID STREP A (OFFICE)  POC SARS CORONAVIRUS 2 AG -  ED    EKG   Radiology No results found.  Procedures Procedures (including critical care time)  Medications Ordered in UC Medications  penicillin g benzathine (BICILLIN LA) 1200000 UNIT/2ML injection 1.2 Million Units (has no administration in time range)  acetaminophen (TYLENOL) tablet 650 mg (650 mg Oral Given 05/26/20 0825)    Initial Impression / Assessment and Plan / UC Course  I have reviewed the triage vital signs and the nursing notes.  Pertinent labs & imaging results that were available during my care of the patient were reviewed by me and considered in my medical decision making (see chart for details).   Strep throat; viral illness.  Rapid strep positive.  POC COVID negative; PCR pending.  Instructed patient to self quarantine  until the test result is back and to take Tylenol as needed for fever/discomfort.  Instructed patient to go to the emergency department if she develops high fever, shortness of breath, severe diarrhea, or other concerning symptoms.  Patient agrees with plan of care.    Final Clinical Impressions(s) / UC Diagnoses   Final diagnoses:  Streptococcal sore throat  Viral illness     Discharge Instructions     Your rapid strep test is positive.   You were given an injection of long-acting penicillin today.  No additional antibiotic treatment is needed at this time.    Your rapid COVID test is negative; the send-out test is pending.  You should self quarantine until your test result is back and is negative.    Go to the emergency department if you develop high fever, shortness of breath, severe diarrhea, or other concerning symptoms.            ED Prescriptions    None     PDMP not reviewed this encounter.   Mickie Bail, NP 05/26/20 [phone]

## 2020-05-26 NOTE — Discharge Instructions (Addendum)
Your rapid strep test is positive.   You were given an injection of long-acting penicillin today.  No additional antibiotic treatment is needed at this time.    Your rapid COVID test is negative; the send-out test is pending.  You should self quarantine until your test result is back and is negative.    Go to the emergency department if you develop high fever, shortness of breath, severe diarrhea, or other concerning symptoms.

## 2020-05-28 LAB — SARS-COV-2, NAA 2 DAY TAT

## 2020-05-28 LAB — NOVEL CORONAVIRUS, NAA: SARS-CoV-2, NAA: NOT DETECTED

## 2020-07-27 ENCOUNTER — Other Ambulatory Visit: Payer: Self-pay

## 2020-07-27 ENCOUNTER — Ambulatory Visit (INDEPENDENT_AMBULATORY_CARE_PROVIDER_SITE_OTHER): Payer: 59 | Admitting: Family Medicine

## 2020-07-27 ENCOUNTER — Encounter: Payer: Self-pay | Admitting: Family Medicine

## 2020-07-27 VITALS — BP 112/82 | HR 72 | Temp 97.7°F | Resp 16 | Ht 69.0 in | Wt 222.6 lb

## 2020-07-27 DIAGNOSIS — G43109 Migraine with aura, not intractable, without status migrainosus: Secondary | ICD-10-CM

## 2020-07-27 DIAGNOSIS — Z23 Encounter for immunization: Secondary | ICD-10-CM | POA: Diagnosis not present

## 2020-07-27 MED ORDER — NURTEC 75 MG PO TBDP: 75.0000 mg | ORAL_TABLET | Freq: Every day | ORAL | 2 refills | Status: DC | PRN

## 2020-07-27 NOTE — Patient Instructions (Addendum)
Thank you for coming to the office today.  [facility] Mail order - we will submit the Nurtec ODT 75mg  rx - 8 pills for 30 days with refill.  You should get a initial covered pill pack within 24 hours - (initiated - may take longer to arrive) then approval will be worked on.  Stay tuned.  In future we can always re consider adjust dose of Sumatriptan imitrex or refer to Neurology or preventative med if needed.  [phone]  Please schedule a Follow-up Appointment to: Return in about 3 months (around 10/27/2020), or if symptoms worsen or fail to improve, for migraine, or can do yearly if doing well.  If you have any other questions or concerns, please feel free to call the office or send a message through MyChart. You may also schedule an earlier appointment if necessary.  Additionally, you may be receiving a survey about your experience at our office within a few days to 1 week by e-mail or mail. We value your feedback.  10/29/2020, DO [facility], [facility]

## 2020-07-27 NOTE — Progress Notes (Signed)
Subjective:    Patient ID: [name], female    DOB: 09/03/1986, 34 y.o.   MRN: [id number]  [name] is a 34 y.o. female presenting on 07/27/2020 for Migraine    HPI   Migraine episodic, recurrent with AURA Last visit 05/2019, she was managed previously with Sumatriptan and took PRN and then she had nearly 1 year without migraine headache. She has taken med with good results. In past. She has had 2 migraines in past few months and difficulty with identifying triggers for migraines, has had most recent about 1 week ago Thursday with severe migraine with severe nausea associated with having hot flashes and cold chills and pins and needles, more centralized pain, headache lasted up to 5 years.  Other than last episode, has done well in past 1 year with minimal migraines. She does not take preventative medication for migraines.  She still has Sumatriptan imitrex but now it is expired, since 03/2019. She is here for refill of medication or new med.  Denies any fever chills weakness numbness tingling vision changes, active headache   Health Maintenance:  Due for Flu Shot, will receive today   UTD COVID vaccine, last dose 05/2020  Depression screen [facility] 2/9 07/27/2020 04/01/2019  Decreased Interest 0 0  Down, Depressed, Hopeless 0 0  PHQ - 2 Score 0 0    Social History   Tobacco Use  . Smoking status: Never Smoker  . Smokeless tobacco: Never Used  Vaping Use  . Vaping Use: Never used  Substance Use Topics  . Alcohol use: Yes    Comment: occasionally twice a week  . Drug use: No    Review of Systems Per HPI unless specifically indicated above     Objective:    BP 112/82   Pulse 72   Temp 97.7 F (36.5 C) (Temporal)   Resp 16   Ht 5\' 9"  (1.753 m)   Wt 222 lb 9.6 oz (101 kg)   SpO2 100%   BMI 32.87 kg/m   Wt Readings from Last 3 Encounters:  07/27/20 222 lb 9.6 oz (101 kg)  05/13/19 208 lb 12.8 oz (94.7 kg)  04/17/19 203 lb (92.1 kg)    Physical  Exam Vitals and nursing note reviewed.  Constitutional:      General: She is not in acute distress.    Appearance: She is well-developed. She is not diaphoretic.     Comments: Well-appearing, comfortable, cooperative  HENT:     Head: Normocephalic and atraumatic.  Eyes:     General:        Right eye: No discharge.        Left eye: No discharge.     Conjunctiva/sclera: Conjunctivae normal.  Cardiovascular:     Rate and Rhythm: Normal rate.  Pulmonary:     Effort: Pulmonary effort is normal.  Skin:    General: Skin is warm and dry.     Findings: No erythema or rash.  Neurological:     Mental Status: She is alert and oriented to person, place, and time.  Psychiatric:        Behavior: Behavior normal.     Comments: Well groomed, good eye contact, normal speech and thoughts    Results for orders placed or performed during the hospital encounter of 05/26/20  Novel Coronavirus, NAA ([facility])   Specimen: Nasal Swab; Nasopharyngeal(NP) swabs in vial transport medium   Nasopharynge  Patient  Result Value Ref Range   SARS-CoV-2, NAA Not  Detected Not Detected  SARS-COV-2, NAA 2 DAY TAT   Nasopharynge  Patient  Result Value Ref Range   SARS-CoV-2, NAA 2 DAY TAT Performed   POCT rapid strep A  Result Value Ref Range   Rapid Strep A Screen Positive (A) Negative  POC SARS Coronavirus 2 Ag-ED - Throat  Result Value Ref Range   SARS Coronavirus 2 Ag Negative Negative      Assessment & Plan:   Problem List Items Addressed This Visit    Migraine with aura and without status migrainosus, not intractable - Primary   Relevant Medications   NURTEC 75 MG TBDP    Other Visit Diagnoses    Needs flu shot       Relevant Orders   Flu Vaccine QUAD 36+ mos IM      Consistent with episodic migraine HA Recently with increased frequency headaches, < 8 headache days per month Last migraine headache was more severe with additional symptoms - with nausea and other - Currently without active  HA, well-appearing, no focal neuro deficits, tolerating PO w/o n/v  Less effective on Sumatriptan imitrex PO now. Unsure exact triggers for migraine  Plan: 1. Start NEW rx Nurtec ODT 75mg  daily PRN migraine - max dose in 24 hours is 1 pill, recommend wait every other day between doses, rx for abortive therapy 75mg  tabs - (#8 tab and 2 refill)  send to [facility] pharmacy for assistance with patient to receive med initially and then will work on PA as required  - Note in future if we cannot get approved we can consider return to Sumatriptan and take double dose or repeat dose in 2 hours if unresolved - Reviewed NSAID / Tylenol / Excedrin PRN for mild headache  Avoid triggers including foods, caffeine. Important to rest. - Discussion on future migraine prophylaxis medications if interested  Return criteria given for acute migraine, when to go to office vs ED  ____________________________________________________ Additional Rx Information (May be used for Prior Authorization if required)  Medication name and Strength: Nurtec ODT 75mg   1. Primary Diagnosis and ICD10 Code: Migraine with aura without status (G43.109) 2. Secondary Diagnosis and ICD10 Code: n/a  3. Previous Failed Medications (Duration or Start Date) a. Sumatriptan (Imitrex) (03/2019) b. Ibuprofen 800mg  (03/2019) 4. Quantity and Duration of New Medication: #8 pills for 30 days 5. Additional Supporting Information: __________________________________    Meds ordered this encounter  Medications  . NURTEC 75 MG TBDP    Sig: Take 75 mg by mouth daily as needed (migraine headache). Max 1 tablet in 24 hours.    Dispense:  8 tablet    Refill:  2    30 day supply, Abortive therapy for migraine headache, failed sumatriptan previously.      Follow up plan: Return in about 3 months (around 10/27/2020), or if symptoms worsen or fail to improve, for migraine, or can do yearly if doing well.  Note patient was unable to get labs for  physical covered last year, had high cost with labs, she wants to follow-up PRN and treat acute issues instead of physical at this time.  08-[birth year]1993, DO [facility] 07/27/2020, 2:15 PM

## 2020-11-09 HISTORY — PX: ELBOW SURGERY: SHX618

## 2020-11-19 ENCOUNTER — Other Ambulatory Visit: Payer: Self-pay | Admitting: Family Medicine

## 2020-11-19 DIAGNOSIS — G43109 Migraine with aura, not intractable, without status migrainosus: Secondary | ICD-10-CM

## 2020-11-19 MED ORDER — NURTEC 75 MG PO TBDP: 75.0000 mg | ORAL_TABLET | Freq: Every day | ORAL | 5 refills | Status: DC | PRN

## 2020-11-28 ENCOUNTER — Emergency Department: Payer: 59

## 2020-11-28 ENCOUNTER — Emergency Department
Admission: EM | Admit: 2020-11-28 | Discharge: 2020-11-28 | Disposition: A | Discharge status: Home / Self Care | Payer: 59 | Attending: Emergency Medicine | Admitting: Emergency Medicine

## 2020-11-28 DIAGNOSIS — I10 Essential (primary) hypertension: Secondary | ICD-10-CM | POA: Insufficient documentation

## 2020-11-28 DIAGNOSIS — S59901A Unspecified injury of right elbow, initial encounter: Secondary | ICD-10-CM | POA: Diagnosis present

## 2020-11-28 DIAGNOSIS — S42401A Unspecified fracture of lower end of right humerus, initial encounter for closed fracture: Secondary | ICD-10-CM

## 2020-11-28 DIAGNOSIS — Y9301 Activity, walking, marching and hiking: Secondary | ICD-10-CM | POA: Insufficient documentation

## 2020-11-28 DIAGNOSIS — W109XXA Fall (on) (from) unspecified stairs and steps, initial encounter: Secondary | ICD-10-CM | POA: Insufficient documentation

## 2020-11-28 DIAGNOSIS — S42421A Displaced comminuted supracondylar fracture without intercondylar fracture of right humerus, initial encounter for closed fracture: Secondary | ICD-10-CM | POA: Diagnosis not present

## 2020-11-28 LAB — BASIC METABOLIC PANEL
Anion gap: 12 (ref 5–15)
BUN: 20 mg/dL (ref 6–20)
CO2: 19 mmol/L — ABNORMAL LOW (ref 22–32)
Calcium: 9.4 mg/dL (ref 8.9–10.3)
Chloride: 105 mmol/L (ref 98–111)
Creatinine, Ser: 0.64 mg/dL (ref 0.44–1.00)
GFR, Estimated: >60 mL/min (ref >60)
Glucose, Bld: 107 mg/dL — ABNORMAL HIGH (ref 70–99)
Potassium: 3.5 mmol/L (ref 3.5–5.1)
Sodium: 136 mmol/L (ref 135–145)

## 2020-11-28 LAB — CBC WITH DIFFERENTIAL/PLATELET
Abs Immature Granulocytes: 0.03 10*3/uL (ref 0.00–0.07)
Basophils Absolute: 0 10*3/uL (ref 0.0–0.1)
Basophils Relative: 0 %
Eosinophils Absolute: 0.1 10*3/uL (ref 0.0–0.5)
Eosinophils Relative: 1 %
HCT: 38.6 % (ref 36.0–46.0)
Hemoglobin: 13.2 g/dL (ref 12.0–15.0)
Immature Granulocytes: 0 %
Lymphocytes Relative: 34 %
Lymphs Abs: 3.1 10*3/uL (ref 0.7–4.0)
MCH: 31.4 pg (ref 26.0–34.0)
MCHC: 34.2 g/dL (ref 30.0–36.0)
MCV: 91.9 fL (ref 80.0–100.0)
Monocytes Absolute: 0.6 10*3/uL (ref 0.1–1.0)
Monocytes Relative: 7 %
Neutro Abs: 5.1 10*3/uL (ref 1.7–7.7)
Neutrophils Relative %: 58 %
Platelets: 293 10*3/uL (ref 150–400)
RBC: 4.2 MIL/uL (ref 3.87–5.11)
RDW: 11.4 % — ABNORMAL LOW (ref 11.5–15.5)
WBC: 9 10*3/uL (ref 4.0–10.5)
nRBC: 0 % (ref 0.0–0.2)

## 2020-11-28 LAB — PROTIME-INR
INR: 1.1 (ref 0.8–1.2)
Prothrombin Time: 14.2 seconds (ref 11.4–15.2)

## 2020-11-28 LAB — TYPE AND SCREEN

## 2020-11-28 LAB — HCG, QUANTITATIVE, PREGNANCY: hCG, Beta Chain, Quant, S: <1 m[IU]/mL (ref <5)

## 2020-11-28 MED ORDER — OXYCODONE-ACETAMINOPHEN 5-325 MG PO TABS: 1.0000 | ORAL_TABLET | ORAL | 0 refills | Status: DC | PRN

## 2020-11-28 MED ORDER — FENTANYL CITRATE (PF) 100 MCG/2ML IJ SOLN
50.0000 ug | Freq: Once | INTRAMUSCULAR | Status: AC
  Administered 2020-11-28: 50 ug via INTRAVENOUS
  Filled 2020-11-28: qty 2

## 2020-11-28 MED ORDER — KETOROLAC TROMETHAMINE 30 MG/ML IJ SOLN
INTRAMUSCULAR | Status: AC
  Filled 2020-11-28: qty 1

## 2020-11-28 MED ORDER — KETOROLAC TROMETHAMINE 30 MG/ML IJ SOLN
30.0000 mg | Freq: Once | INTRAMUSCULAR | Status: AC
  Administered 2020-11-28: 30 mg via INTRAVENOUS

## 2020-11-28 MED ORDER — HYDROMORPHONE HCL 1 MG/ML IJ SOLN
1.0000 mg | Freq: Once | INTRAMUSCULAR | Status: AC
  Administered 2020-11-28: 1 mg via INTRAVENOUS
  Filled 2020-11-28: qty 1

## 2020-11-28 MED ORDER — OXYCODONE HCL 5 MG PO TABS
5.0000 mg | ORAL_TABLET | ORAL | Status: AC
  Administered 2020-11-28: 5 mg via ORAL
  Filled 2020-11-28: qty 1

## 2020-11-28 MED ORDER — ACETAMINOPHEN 500 MG PO TABS
1000.0000 mg | ORAL_TABLET | Freq: Once | ORAL | Status: AC
  Administered 2020-11-28: 1000 mg via ORAL
  Filled 2020-11-28: qty 2

## 2020-11-28 NOTE — ED Provider Notes (Signed)
[facility] Emergency Department Provider Note  ____________________________________________   Event Date/Time   First MD Initiated Contact with Patient 11/28/20 1757     (approximate)  I have reviewed the triage vital signs and the nursing notes.   HISTORY  Chief Complaint Arm Injury (Right arm deformity , elbow fall, no hit head, no loc )   HPI [name] is a 35 y.o. female with past medical history of anxiety, HTN, seizures, and previous ectopic pregnancy presents EMS from home for assessment of right elbow pain.  Patient states she was walking down some steps and got her foot caught on something and tripped falling onto her right upper arm.  She states she has some bruising around both knees but most of her pain is in her right arm extending down to her wrist.  She denies any headache, earache, sore throat, back pain, chest pain, dental pain, left upper extremity pain, hip pain, ankle pain or any other acute sick symptoms.  No recent fevers, cough, vomiting, diarrhea or dysuria, rash or any recent injuries.  She denies any blood thinners.  She not hit her head or have LOC.  She did receive 100 mcg of fentanyl prior to arrival.         Past Medical History:  Diagnosis Date  . Allergy   . Anxiety   . Ectopic pregnancy   . Frequent headaches   . GERD (gastroesophageal reflux disease)   . Hypertension   . Seizures [facility])     Patient Active Problem List   Diagnosis Date Noted  . History of abnormal cervical Pap smear 04/01/2019  . History of ectopic pregnancy 04/01/2019  . Migraine with aura and without status migrainosus, not intractable 04/01/2019    Past Surgical History:  Procedure Laterality Date  . CHOLECYSTECTOMY  2015  . COLPOSCOPY    . DIAGNOSTIC LAPAROSCOPY WITH REMOVAL OF ECTOPIC PREGNANCY Right 02/13/2017   Procedure: DIAGNOSTIC LAPAROSCOPY WITH REMOVAL OF ECTOPIC PREGNANCY, right salpingectomy;  Surgeon: Linzie Collin, MD;   Location: [facility] ORS;  Service: Gynecology;  Laterality: Right;    Prior to Admission medications   Medication Sig Start Date End Date Taking? Authorizing Provider  oxyCODONE-acetaminophen (PERCOCET) 5-325 MG tablet Take 1 tablet by mouth every 4 (four) hours as needed for up to 5 days for severe pain. 11/28/20 12/03/20 Yes Gilles Chiquito, MD  NURTEC 75 MG TBDP Take 75 mg by mouth daily as needed (migraine headache). Max 1 tablet in 24 hours. 11/19/20   Karamalegos, Netta Neat, DO    Allergies Bupropion  Family History  Problem Relation Age of Onset  . Alcohol abuse Mother   . Alcohol abuse Father     Social History Social History   Tobacco Use  . Smoking status: Never Smoker  . Smokeless tobacco: Never Used  Vaping Use  . Vaping Use: Never used  Substance Use Topics  . Alcohol use: Yes    Comment: occasionally twice a week  . Drug use: No    Review of Systems  Review of Systems  Constitutional: Negative for chills and fever.  HENT: Negative for sore throat.   Eyes: Negative for pain.  Respiratory: Negative for cough and stridor.   Cardiovascular: Negative for chest pain.  Gastrointestinal: Negative for vomiting.  Genitourinary: Negative for dysuria.  Musculoskeletal: Positive for joint pain ( R wrist, R elbow, R knee, L knee) and myalgias ( R elbow, R wrist).  Skin: Negative for rash.  Neurological:  Negative for seizures, loss of consciousness and headaches.  Psychiatric/Behavioral: Negative for suicidal ideas.  All other systems reviewed and are negative.     ____________________________________________   PHYSICAL EXAM:  VITAL SIGNS: ED Triage Vitals  Enc Vitals Group     BP --      Pulse Rate 11/28/20 1756 97     Resp 11/28/20 1756 17     Temp 11/28/20 1756 98.1 F (36.7 C)     Temp Source 11/28/20 1756 Oral     SpO2 11/28/20 1756 98 %     Weight --      Height --      Head Circumference --      Peak Flow --      Pain Score 11/28/20 1757 9      Pain Loc --      Pain Edu? --      Excl. in GC? --    Vitals:   11/28/20 2015 11/28/20 2155  BP: 122/80 125/83  Pulse: 92 70  Resp: 14 14  Temp:    SpO2: 99% 98%   Physical Exam Vitals and nursing note reviewed.  Constitutional:      General: She is in acute distress.     Appearance: She is well-developed. She is ill-appearing.  HENT:     Head: Normocephalic and atraumatic.     Right Ear: External ear normal.     Left Ear: External ear normal.     Nose: Nose normal.  Eyes:     Conjunctiva/sclera: Conjunctivae normal.  Cardiovascular:     Rate and Rhythm: Normal rate and regular rhythm.     Heart sounds: No murmur heard.   Pulmonary:     Effort: Pulmonary effort is normal. No respiratory distress.     Breath sounds: Normal breath sounds.  Abdominal:     Palpations: Abdomen is soft.     Tenderness: There is no abdominal tenderness.  Musculoskeletal:     Cervical back: Neck supple.  Skin:    General: Skin is warm and dry.     Capillary Refill: Capillary refill takes less than 2 seconds.  Neurological:     Mental Status: She is alert and oriented to person, place, and time.     No tenderness step-offs or deformities over the C/T/L-spine.  2+ bilateral radial and DP pulses.  Cranial nerves II through XII grossly intact.  Patient has full strength on her left upper extremity and bilateral lower extremities.  She is unable to flex or extend her right elbow and has minimal flexion extension strength on her right wrist.  No snuffbox tenderness.  She is able to flex and extend all digits in the right hand against resistance.  She has less than 2-second cap refill in all digits in the right hand.  Sensation is intact in the distribution of the ulnar radial median nerves in the right hand.  There is significant edema and tenderness about the right elbow.  Scattered abrasions noted over the anterior aspect of the bilateral knees.  Minor tenderness over the patella bilaterally but no  deformity effusion or other evidence of trauma to the bilateral lower extremities. ____________________________________________   LABS (all labs ordered are listed, but only abnormal results are displayed)  Labs Reviewed  CBC WITH DIFFERENTIAL/PLATELET - Abnormal; Notable for the following components:      Result Value   RDW 11.4 (*)    All other components within normal limits  BASIC METABOLIC PANEL - Abnormal; Notable  for the following components:   CO2 19 (*)    Glucose, Bld 107 (*)    All other components within normal limits  PROTIME-INR  HCG, QUANTITATIVE, PREGNANCY  TYPE AND SCREEN  TYPE AND SCREEN  TYPE AND SCREEN   ____________________________________________  EKG  ____________________________________________  RADIOLOGY  ED MD interpretation: Plain films of the patient's right wrist, right knee and left knee are unremarkable.  Plain film of the right elbow shows comminuted distal humerus fracture.  CT right elbow shows comminuted distal humerus fracture with some displaced fragments.  Official radiology report(s): DG Elbow Complete Right  Result Date: 11/28/2020 CLINICAL DATA:  Fall, left elbow injury, deformity EXAM: RIGHT ELBOW - COMPLETE 3+ VIEW COMPARISON:  None. FINDINGS: There is a highly comminuted displaced distal right humeral fracture. Moderately displaced fracture fragments. Intra-articular extension. Joint effusion present. IMPRESSION: Highly comminuted, displaced intra-articular distal right humeral fracture. Electronically Signed   By: Charlett Nose M.D.   On: 11/28/2020 18:50   DG Wrist Complete Right  Result Date: 11/28/2020 CLINICAL DATA:  Fall, pain EXAM: RIGHT WRIST - COMPLETE 3+ VIEW COMPARISON:  None. FINDINGS: Limited study due to positioning, likely related to patient condition. No visible fracture, subluxation or dislocation. IMPRESSION: Limited study due to positioning.  No visible acute findings. Electronically Signed   By: Charlett Nose M.D.    On: 11/28/2020 18:51   CT Elbow Right Wo Contrast  Result Date: 11/28/2020 CLINICAL DATA:  Right elbow fracture EXAM: CT OF THE LOWER RIGHT EXTREMITY WITHOUT CONTRAST TECHNIQUE: Multidetector CT imaging of the right lower extremity was performed according to the standard protocol. COMPARISON:  Radiographs 11/28/2020 FINDINGS: Bones/Joint/Cartilage Displaced supracondylar fracture with the distal fragment about 1.7 cm posterior to the proximal fragment medially. Medial and lateral comminution along the fracture plane. No fracture of the proximal ulna or proximal radius identified. Ligaments Suboptimally assessed by CT. Muscles and Tendons Hematoma and edema tracks along muscle planes especially medially. Soft tissues Subcutaneous edema and hematoma posteromedially. IMPRESSION: 1. Displaced supracondylar fracture with the distal fragment about 1.7 cm posterior to the proximal fragment medially. Medial and lateral comminution along the fracture plane. 2. Hematoma and edema tracks along muscle planes especially medially. Electronically Signed   By: Gaylyn Rong M.D.   On: 11/28/2020 21:14   DG Knee Complete 4 Views Left  Result Date: 11/28/2020 CLINICAL DATA:  Fall, knee abrasions EXAM: LEFT KNEE - COMPLETE 4+ VIEW COMPARISON:  None. FINDINGS: No evidence of fracture, dislocation, or joint effusion. No evidence of arthropathy or other focal bone abnormality. Soft tissues are unremarkable. IMPRESSION: Negative. Electronically Signed   By: Charlett Nose M.D.   On: 11/28/2020 18:50   DG Knee Complete 4 Views Right  Result Date: 11/28/2020 CLINICAL DATA:  Fall, knee abrasions EXAM: RIGHT KNEE - COMPLETE 4+ VIEW COMPARISON:  None. FINDINGS: No evidence of fracture, dislocation, or joint effusion. No evidence of arthropathy or other focal bone abnormality. Soft tissues are unremarkable. IMPRESSION: Negative. Electronically Signed   By: Charlett Nose M.D.   On: 11/28/2020 18:51     ____________________________________________   PROCEDURES  Procedure(s) performed (including Critical Care):  .1-3 Lead EKG Interpretation Performed by: Gilles Chiquito, MD Authorized by: Gilles Chiquito, MD     Interpretation: normal     ECG rate assessment: normal     Rhythm: sinus rhythm     Ectopy: none     Conduction: normal       ____________________________________________   INITIAL IMPRESSION /  ASSESSMENT AND PLAN / ED COURSE      Patient presents with above to history exam for assessment of primarily right elbow pain after ground-level fall described above where she fell onto her right elbow.  On arrival she is afebrile and hemodynamically stable.  Exam remarkable for decreased strength tenderness and edema at the right elbow and some decreased into the right wrist.  Discussed abrasions over both knees and some tenderness over the patella bilaterally.  She is otherwise neurovascular intact in all extremities.  No other obvious trauma on exam.  Plain films of the patient's knees and right wrist are unremarkable.  Suspicion for occult fracture in these areas.  Plain film of the right elbow does show a comminuted distal humerus fracture.  CBC BMP INR and hCG are all unremarkable.  These were obtained in anticipation of possible operative repair.  Discussed with on-call orthopedist Dr. Hyacinth MeekerMiller hospitalist Dr. Monica MartinezMinz both stated patient's injuries were beyond scope of the practice and recommended specialist consult [place].  [facility] and reviewed patient's imaging and recommended obtaining a CT and placing patient in a elbow splint with outpatient follow-up.  CT obtained.  Splint applied.  Rx written for Percocet.  Strict return precautions including strict compartment syndrome precautions discussed.  Discharged in stable condition with plan for outpatient follow-up and likely operative repair.       ____________________________________________   FINAL CLINICAL IMPRESSION(S) / ED DIAGNOSES  Final diagnoses:  Closed fracture of right elbow, initial encounter    Medications  ketorolac (TORADOL) 30 MG/ML injection (has no administration in time range)  fentaNYL (SUBLIMAZE) injection 50 mcg (50 mcg Intravenous Given 11/28/20 1810)  acetaminophen (TYLENOL) tablet 1,000 mg (1,000 mg Oral Given 11/28/20 1832)  HYDROmorphone (DILAUDID) injection 1 mg (1 mg Intravenous Given 11/28/20 1832)  oxyCODONE (Oxy IR/ROXICODONE) immediate release tablet 5 mg (5 mg Oral Given 11/28/20 1944)  ketorolac (TORADOL) 30 MG/ML injection 30 mg (30 mg Intravenous Given 11/28/20 2118)     ED Discharge Orders         Ordered    oxyCODONE-acetaminophen (PERCOCET) 5-325 MG tablet  Every 4 hours PRN        11/28/20 2203           Note:  This document was prepared using Dragon voice recognition software and may include unintentional dictation errors.   Gilles ChiquitoSmith, Laberta Wilbon P, MD 11/29/20 [id number]

## 2020-11-28 NOTE — ED Triage Notes (Signed)
Fell down steps, no loc, left arm injury/deformity

## 2020-11-29 ENCOUNTER — Encounter (HOSPITAL_BASED_OUTPATIENT_CLINIC_OR_DEPARTMENT_OTHER): Payer: Self-pay | Admitting: Orthopaedic Surgery

## 2020-11-29 ENCOUNTER — Other Ambulatory Visit (HOSPITAL_COMMUNITY)
Admission: RE | Admit: 2020-11-29 | Discharge: 2020-11-29 | Disposition: A | Discharge status: Home / Self Care | Payer: 59 | Source: Ambulatory Visit | Attending: Emergency Medicine | Admitting: Emergency Medicine

## 2020-11-29 DIAGNOSIS — Z01812 Encounter for preprocedural laboratory examination: Secondary | ICD-10-CM | POA: Insufficient documentation

## 2020-11-29 DIAGNOSIS — Z20822 Contact with and (suspected) exposure to covid-19: Secondary | ICD-10-CM | POA: Insufficient documentation

## 2020-11-30 ENCOUNTER — Encounter (HOSPITAL_BASED_OUTPATIENT_CLINIC_OR_DEPARTMENT_OTHER): Payer: Self-pay | Admitting: Orthopaedic Surgery

## 2020-11-30 ENCOUNTER — Other Ambulatory Visit: Payer: Self-pay

## 2020-11-30 LAB — SARS CORONAVIRUS 2 (TAT 6-24 HRS): SARS Coronavirus 2: NEGATIVE

## 2020-12-01 NOTE — H&P (Signed)
PREOPERATIVE H&P  Chief Complaint: RIGHT ELBOW SUPER CHONDIAL FRACTURE  HPI: [name] is a 35 y.o. female who is scheduled for, Procedure(s): OPEN REDUCTION INTERNAL FIXATION (ORIF) DISTAL HUMERUS FRACTURE.   Patient has a past medical history significant for anxiety, HTN, seizures.   Patient presented to [facility] via EMS from home after falling down stairs and landing on her right arm. Work-up in the ED was significant for comminuted distal humerus fracture. She was sent to orthopedics for surgical discussion.   Her symptoms are rated as moderate to severe, and have been worsening.  This is significantly impairing activities of daily living.    Please see clinic note for further details on this patient's care.    She has elected for surgical management.   Past Medical History:  Diagnosis Date  . Alcoholic cirrhosis of liver (HCC)    12/2011  . Allergy   . Anxiety   . Depression   . Ectopic pregnancy   . Frequent headaches   . GERD (gastroesophageal reflux disease)   . Hypertension    no issue since 2011  . Seizures (HCC)    x1 in 2012   Past Surgical History:  Procedure Laterality Date  . CHOLECYSTECTOMY  2015  . COLPOSCOPY    . DIAGNOSTIC LAPAROSCOPY WITH REMOVAL OF ECTOPIC PREGNANCY Right 02/13/2017   Procedure: DIAGNOSTIC LAPAROSCOPY WITH REMOVAL OF ECTOPIC PREGNANCY, right salpingectomy;  Surgeon: Linzie Collin, MD;  Location: [facility] ORS;  Service: Gynecology;  Laterality: Right;   Social History   Socioeconomic History  . Marital status: Legally Separated    Spouse name: Not on file  . Number of children: Not on file  . Years of education: Not on file  . Highest education level: Not on file  Occupational History  . Not on file  Tobacco Use  . Smoking status: Never Smoker  . Smokeless tobacco: Never Used  Vaping Use  . Vaping Use: Never used  Substance and Sexual Activity  . Alcohol use: Yes    Comment: occasionally  twice a week  . Drug use: No  . Sexual activity: Yes  Other Topics Concern  . Not on file  Social History Narrative  . Not on file   Social Determinants of Health   Financial Resource Strain: Not on file  Food Insecurity: Not on file  Transportation Needs: Not on file  Physical Activity: Not on file  Stress: Not on file  Social Connections: Not on file   Family History  Problem Relation Age of Onset  . Alcohol abuse Mother   . Alcohol abuse Father    Allergies  Allergen Reactions  . Bupropion Other (See Comments)    Causes seizures. Seizure    Prior to Admission medications   Medication Sig Start Date End Date Taking? Authorizing Provider  NURTEC 75 MG TBDP Take 75 mg by mouth daily as needed (migraine headache). Max 1 tablet in 24 hours. 11/19/20  Yes Karamalegos, Netta Neat, DO  oxyCODONE-acetaminophen (PERCOCET) 5-325 MG tablet Take 1 tablet by mouth every 4 (four) hours as needed for up to 5 days for severe pain. 11/28/20 12/03/20 Yes Gilles Chiquito, MD    ROS: All other systems have been reviewed and were otherwise negative with the exception of those mentioned in the HPI and as above.  Physical Exam: General: Alert, no acute distress Cardiovascular: No pedal edema Respiratory: No cyanosis, no use of accessory musculature GI: No organomegaly, abdomen is soft  and non-tender Skin: No lesions in the area of chief complaint Neurologic: Sensation intact distally Psychiatric: Patient is competent for consent with normal mood and affect Lymphatic: No axillary or cervical lymphadenopathy  MUSCULOSKELETAL:  Right upper extremitiy: Splint CDI*. Skin intact though cannot assess fully beneath splint. Nontender to palpation proximally. Able to wiggle her fingers slightly. Sensation intact distally. Well perfused digits.    Imaging: CT of the right elbow demonstrates displaced supracondylar fracture  Assessment: RIGHT ELBOW SUPER CHONDIAL FRACTURE  Plan: Plan for  Procedure(s): OPEN REDUCTION INTERNAL FIXATION (ORIF) DISTAL HUMERUS FRACTURE  The risks benefits and alternatives were discussed with the patient including but not limited to the risks of nonoperative treatment, versus surgical intervention including infection, bleeding, nerve injury,  blood clots, cardiopulmonary complications, morbidity, mortality, among others, and they were willing to proceed.   The patient acknowledged the explanation, agreed to proceed with the plan and consent was signed.   Operative Plan: ORIF right distal humerus with ulnar nerve transposition Discharge Medications: Standard  DVT Prophylaxis: None Physical Therapy: Outpatient PT Special Discharge needs: Splint. Sling   Vernetta Honey, PA-C  12/01/2020 4:43 PM

## 2020-12-02 ENCOUNTER — Encounter (HOSPITAL_BASED_OUTPATIENT_CLINIC_OR_DEPARTMENT_OTHER)
Admission: RE | Disposition: A | Discharge status: Home / Self Care | Payer: Self-pay | Source: Home / Self Care | Attending: Orthopaedic Surgery

## 2020-12-02 ENCOUNTER — Ambulatory Visit (HOSPITAL_BASED_OUTPATIENT_CLINIC_OR_DEPARTMENT_OTHER): Payer: 59 | Admitting: Certified Registered"

## 2020-12-02 ENCOUNTER — Ambulatory Visit (HOSPITAL_COMMUNITY): Payer: 59

## 2020-12-02 ENCOUNTER — Other Ambulatory Visit: Payer: Self-pay

## 2020-12-02 ENCOUNTER — Encounter (HOSPITAL_BASED_OUTPATIENT_CLINIC_OR_DEPARTMENT_OTHER): Payer: Self-pay | Admitting: Orthopaedic Surgery

## 2020-12-02 ENCOUNTER — Ambulatory Visit (HOSPITAL_BASED_OUTPATIENT_CLINIC_OR_DEPARTMENT_OTHER)
Admission: RE | Admit: 2020-12-02 | Discharge: 2020-12-02 | Disposition: A | Discharge status: Home / Self Care | Payer: 59 | Attending: Orthopaedic Surgery | Admitting: Orthopaedic Surgery

## 2020-12-02 DIAGNOSIS — W109XXA Fall (on) (from) unspecified stairs and steps, initial encounter: Secondary | ICD-10-CM | POA: Diagnosis not present

## 2020-12-02 DIAGNOSIS — S42411A Displaced simple supracondylar fracture without intercondylar fracture of right humerus, initial encounter for closed fracture: Secondary | ICD-10-CM | POA: Insufficient documentation

## 2020-12-02 DIAGNOSIS — Z419 Encounter for procedure for purposes other than remedying health state, unspecified: Secondary | ICD-10-CM

## 2020-12-02 DIAGNOSIS — Z79899 Other long term (current) drug therapy: Secondary | ICD-10-CM | POA: Diagnosis not present

## 2020-12-02 DIAGNOSIS — Z888 Allergy status to other drugs, medicaments and biological substances status: Secondary | ICD-10-CM | POA: Diagnosis not present

## 2020-12-02 DIAGNOSIS — G588 Other specified mononeuropathies: Secondary | ICD-10-CM | POA: Diagnosis not present

## 2020-12-02 DIAGNOSIS — S42441A Displaced fracture (avulsion) of medial epicondyle of right humerus, initial encounter for closed fracture: Secondary | ICD-10-CM | POA: Insufficient documentation

## 2020-12-02 DIAGNOSIS — Z09 Encounter for follow-up examination after completed treatment for conditions other than malignant neoplasm: Secondary | ICD-10-CM

## 2020-12-02 HISTORY — DX: Alcoholic cirrhosis of liver without ascites: K70.30

## 2020-12-02 HISTORY — DX: Depression, unspecified: F32.A

## 2020-12-02 HISTORY — PX: ORIF HUMERUS FRACTURE: SHX2126

## 2020-12-02 LAB — POCT PREGNANCY, URINE: Preg Test, Ur: NEGATIVE

## 2020-12-02 SURGERY — OPEN REDUCTION INTERNAL FIXATION (ORIF) DISTAL HUMERUS FRACTURE
Anesthesia: General | Site: Arm Upper | Laterality: Right

## 2020-12-02 MED ORDER — OXYCODONE HCL 5 MG PO TABS: ORAL_TABLET | ORAL | 0 refills | Status: AC

## 2020-12-02 MED ORDER — PROPOFOL 10 MG/ML IV BOLUS
INTRAVENOUS | Status: DC | PRN
  Administered 2020-12-02: 40 mg via INTRAVENOUS
  Administered 2020-12-02: 160 mg via INTRAVENOUS

## 2020-12-02 MED ORDER — MIDAZOLAM HCL 2 MG/2ML IJ SOLN
2.0000 mg | Freq: Once | INTRAMUSCULAR | Status: AC
  Administered 2020-12-02: 2 mg via INTRAVENOUS

## 2020-12-02 MED ORDER — 0.9 % SODIUM CHLORIDE (POUR BTL) OPTIME
TOPICAL | Status: DC | PRN
  Administered 2020-12-02: 1000 mL

## 2020-12-02 MED ORDER — MIDAZOLAM HCL 2 MG/2ML IJ SOLN
INTRAMUSCULAR | Status: AC
  Filled 2020-12-02: qty 2

## 2020-12-02 MED ORDER — LIDOCAINE HCL (CARDIAC) PF 100 MG/5ML IV SOSY
PREFILLED_SYRINGE | INTRAVENOUS | Status: DC | PRN
  Administered 2020-12-02: 60 mg via INTRAVENOUS

## 2020-12-02 MED ORDER — FENTANYL CITRATE (PF) 100 MCG/2ML IJ SOLN
INTRAMUSCULAR | Status: DC | PRN
  Administered 2020-12-02: 25 ug via INTRAVENOUS
  Administered 2020-12-02 (×2): 50 ug via INTRAVENOUS
  Administered 2020-12-02: 75 ug via INTRAVENOUS
  Administered 2020-12-02 (×2): 50 ug via INTRAVENOUS

## 2020-12-02 MED ORDER — BUPIVACAINE LIPOSOME 1.3 % IJ SUSP
INTRAMUSCULAR | Status: DC | PRN
  Administered 2020-12-02: 10 mL via PERINEURAL

## 2020-12-02 MED ORDER — OXYCODONE HCL 5 MG/5ML PO SOLN: 5.0000 mg | Freq: Once | ORAL | Status: AC | PRN

## 2020-12-02 MED ORDER — OXYCODONE HCL 5 MG PO TABS
ORAL_TABLET | ORAL | Status: AC
  Filled 2020-12-02: qty 1

## 2020-12-02 MED ORDER — DEXAMETHASONE SODIUM PHOSPHATE 4 MG/ML IJ SOLN
INTRAMUSCULAR | Status: DC | PRN
  Administered 2020-12-02: 10 mg via INTRAVENOUS

## 2020-12-02 MED ORDER — FENTANYL CITRATE (PF) 100 MCG/2ML IJ SOLN
25.0000 ug | INTRAMUSCULAR | Status: DC | PRN
  Administered 2020-12-02 (×2): 50 ug via INTRAVENOUS

## 2020-12-02 MED ORDER — LACTATED RINGERS IV SOLN: INTRAVENOUS | Status: DC

## 2020-12-02 MED ORDER — FENTANYL CITRATE (PF) 100 MCG/2ML IJ SOLN
INTRAMUSCULAR | Status: AC
  Filled 2020-12-02: qty 2

## 2020-12-02 MED ORDER — KETOROLAC TROMETHAMINE 30 MG/ML IJ SOLN
INTRAMUSCULAR | Status: AC
  Filled 2020-12-02: qty 1

## 2020-12-02 MED ORDER — SCOPOLAMINE 1 MG/3DAYS TD PT72
MEDICATED_PATCH | TRANSDERMAL | Status: AC
  Filled 2020-12-02: qty 1

## 2020-12-02 MED ORDER — CEFAZOLIN SODIUM-DEXTROSE 2-4 GM/100ML-% IV SOLN
INTRAVENOUS | Status: AC
  Filled 2020-12-02: qty 100

## 2020-12-02 MED ORDER — DEXMEDETOMIDINE (PRECEDEX) IN NS 20 MCG/5ML (4 MCG/ML) IV SYRINGE
PREFILLED_SYRINGE | INTRAVENOUS | Status: DC | PRN
  Administered 2020-12-02: 8 ug via INTRAVENOUS

## 2020-12-02 MED ORDER — DEXAMETHASONE SODIUM PHOSPHATE 10 MG/ML IJ SOLN
INTRAMUSCULAR | Status: AC
  Filled 2020-12-02: qty 1

## 2020-12-02 MED ORDER — CELECOXIB 100 MG PO CAPS: 100.0000 mg | ORAL_CAPSULE | Freq: Two times a day (BID) | ORAL | 0 refills | Status: AC

## 2020-12-02 MED ORDER — VANCOMYCIN HCL 1000 MG IV SOLR
INTRAVENOUS | Status: DC | PRN
  Administered 2020-12-02: 1000 mg via TOPICAL

## 2020-12-02 MED ORDER — BUPIVACAINE-EPINEPHRINE (PF) 0.5% -1:200000 IJ SOLN
INTRAMUSCULAR | Status: DC | PRN
  Administered 2020-12-02: 20 mL via PERINEURAL

## 2020-12-02 MED ORDER — MIDAZOLAM HCL 5 MG/5ML IJ SOLN
INTRAMUSCULAR | Status: DC | PRN
  Administered 2020-12-02: 2 mg via INTRAVENOUS

## 2020-12-02 MED ORDER — ONDANSETRON HCL 4 MG/2ML IJ SOLN
INTRAMUSCULAR | Status: DC | PRN
  Administered 2020-12-02: 4 mg via INTRAVENOUS

## 2020-12-02 MED ORDER — SUGAMMADEX SODIUM 200 MG/2ML IV SOLN
INTRAVENOUS | Status: DC | PRN
  Administered 2020-12-02: 200 mg via INTRAVENOUS

## 2020-12-02 MED ORDER — BUPIVACAINE LIPOSOME 1.3 % IJ SUSP: INTRAMUSCULAR | Status: DC | PRN

## 2020-12-02 MED ORDER — ONDANSETRON HCL 4 MG/2ML IJ SOLN
INTRAMUSCULAR | Status: AC
  Filled 2020-12-02: qty 2

## 2020-12-02 MED ORDER — CEFAZOLIN SODIUM-DEXTROSE 2-4 GM/100ML-% IV SOLN
2.0000 g | INTRAVENOUS | Status: AC
  Administered 2020-12-02: 2 g via INTRAVENOUS

## 2020-12-02 MED ORDER — ONDANSETRON HCL 4 MG PO TABS: 4.0000 mg | ORAL_TABLET | Freq: Three times a day (TID) | ORAL | 1 refills | Status: AC | PRN

## 2020-12-02 MED ORDER — ACETAMINOPHEN 500 MG PO TABS: 1000.0000 mg | ORAL_TABLET | Freq: Three times a day (TID) | ORAL | 0 refills | Status: AC

## 2020-12-02 MED ORDER — SCOPOLAMINE 1 MG/3DAYS TD PT72
1.0000 | MEDICATED_PATCH | TRANSDERMAL | Status: DC
  Administered 2020-12-02: 1.5 mg via TRANSDERMAL

## 2020-12-02 MED ORDER — OXYCODONE HCL 5 MG PO TABS
5.0000 mg | ORAL_TABLET | Freq: Once | ORAL | Status: AC | PRN
Start: 2020-12-02 — End: 2020-12-02
  Administered 2020-12-02: 5 mg via ORAL

## 2020-12-02 MED ORDER — FENTANYL CITRATE (PF) 100 MCG/2ML IJ SOLN
100.0000 ug | Freq: Once | INTRAMUSCULAR | Status: AC
Start: 2020-12-02 — End: 2020-12-02
  Administered 2020-12-02: 50 ug via INTRAVENOUS

## 2020-12-02 MED ORDER — ROCURONIUM BROMIDE 100 MG/10ML IV SOLN
INTRAVENOUS | Status: DC | PRN
  Administered 2020-12-02: 70 mg via INTRAVENOUS

## 2020-12-02 MED ORDER — ACETAMINOPHEN 10 MG/ML IV SOLN
INTRAVENOUS | Status: AC
  Filled 2020-12-02: qty 100

## 2020-12-02 MED ORDER — DEXMEDETOMIDINE (PRECEDEX) IN NS 20 MCG/5ML (4 MCG/ML) IV SYRINGE
PREFILLED_SYRINGE | INTRAVENOUS | Status: AC
  Filled 2020-12-02: qty 5

## 2020-12-02 MED ORDER — ACETAMINOPHEN 10 MG/ML IV SOLN
1000.0000 mg | Freq: Once | INTRAVENOUS | Status: AC
  Administered 2020-12-02: 1000 mg via INTRAVENOUS

## 2020-12-02 MED ORDER — ONDANSETRON HCL 4 MG/2ML IJ SOLN: 4.0000 mg | Freq: Once | INTRAMUSCULAR | Status: DC | PRN

## 2020-12-02 MED ORDER — GABAPENTIN 100 MG PO CAPS: 100.0000 mg | ORAL_CAPSULE | Freq: Three times a day (TID) | ORAL | 0 refills | Status: DC

## 2020-12-02 MED ORDER — KETOROLAC TROMETHAMINE 30 MG/ML IJ SOLN
INTRAMUSCULAR | Status: DC | PRN
  Administered 2020-12-02: 30 mg via INTRAVENOUS

## 2020-12-02 SURGICAL SUPPLY — 82 items
APL PRP STRL LF DISP 70% ISPRP (MISCELLANEOUS) ×2
BIT DRILL 2.5X2.75 QC CALB (BIT) ×2 IMPLANT
BIT DRILL CALIBRATED 2.7 (BIT) ×2 IMPLANT
BLADE HEX COATED 2.75 (ELECTRODE) IMPLANT
BLADE SURG 10 STRL SS (BLADE) ×2 IMPLANT
BLADE SURG 15 STRL LF DISP TIS (BLADE) ×1 IMPLANT
BLADE SURG 15 STRL SS (BLADE) ×2
BNDG CMPR 9X4 STRL LF SNTH (GAUZE/BANDAGES/DRESSINGS)
BNDG ELASTIC 4X5.8 VLCR STR LF (GAUZE/BANDAGES/DRESSINGS) ×4 IMPLANT
BNDG ESMARK 4X9 LF (GAUZE/BANDAGES/DRESSINGS) IMPLANT
CHLORAPREP W/TINT 26 (MISCELLANEOUS) ×4 IMPLANT
CLSR STERI-STRIP ANTIMIC 1/2X4 (GAUZE/BANDAGES/DRESSINGS) ×4 IMPLANT
COVER WAND RF STERILE (DRAPES) IMPLANT
CUFF TOURN SGL QUICK 18X3 (MISCELLANEOUS) ×2 IMPLANT
CUFF TOURN SGL QUICK 24 (TOURNIQUET CUFF)
CUFF TRNQT CYL 24X4X16.5-23 (TOURNIQUET CUFF) IMPLANT
DECANTER SPIKE VIAL GLASS SM (MISCELLANEOUS) IMPLANT
DRAPE C-ARM 42X72 X-RAY (DRAPES) ×2 IMPLANT
DRAPE C-ARMOR (DRAPES) ×2 IMPLANT
DRAPE EXTREMITY T 121X128X90 (DISPOSABLE) ×2 IMPLANT
DRAPE INCISE IOBAN 66X45 STRL (DRAPES) IMPLANT
DRAPE OEC MINIVIEW 54X84 (DRAPES) IMPLANT
DRAPE STERI 35X30 U-POUCH (DRAPES) IMPLANT
DRAPE U-SHAPE 47X51 STRL (DRAPES) ×2 IMPLANT
ELECT REM PT RETURN 9FT ADLT (ELECTROSURGICAL) ×2
ELECTRODE REM PT RTRN 9FT ADLT (ELECTROSURGICAL) ×1 IMPLANT
EXT HOSE W/PLC CONNECTION (MISCELLANEOUS) ×2
EXTENSION HOSE W/PLC CONNECTON (MISCELLANEOUS) ×1 IMPLANT
GAUZE SPONGE 4X4 12PLY STRL (GAUZE/BANDAGES/DRESSINGS) ×2 IMPLANT
GLOVE SRG 8 PF TXTR STRL LF DI (GLOVE) ×1 IMPLANT
GLOVE SURG ENC MOIS LTX SZ6.5 (GLOVE) ×4 IMPLANT
GLOVE SURG LTX SZ8 (GLOVE) ×6 IMPLANT
GLOVE SURG UNDER POLY LF SZ6.5 (GLOVE) ×4 IMPLANT
GLOVE SURG UNDER POLY LF SZ8 (GLOVE) ×2
GOWN STRL REUS W/ TWL LRG LVL3 (GOWN DISPOSABLE) ×3 IMPLANT
GOWN STRL REUS W/TWL LRG LVL3 (GOWN DISPOSABLE) ×6
GOWN STRL REUS W/TWL XL LVL3 (GOWN DISPOSABLE) ×2 IMPLANT
K-WIRE FIXATION 2.0X6 (WIRE) ×2
KWIRE FIXATION 2.0X6 (WIRE) ×1 IMPLANT
LOOP VESSEL MAXI BLUE (MISCELLANEOUS) ×2 IMPLANT
NS IRRIG 1000ML POUR BTL (IV SOLUTION) ×2 IMPLANT
PACK ARTHROSCOPY DSU (CUSTOM PROCEDURE TRAY) ×2 IMPLANT
PACK BASIN DAY SURGERY FS (CUSTOM PROCEDURE TRAY) ×2 IMPLANT
PAD CAST 4YDX4 CTTN HI CHSV (CAST SUPPLIES) IMPLANT
PADDING CAST COTTON 4X4 STRL (CAST SUPPLIES)
PENCIL SMOKE EVACUATOR (MISCELLANEOUS) ×2 IMPLANT
PLATE LOCK RT SM (Plate) ×4 IMPLANT
PLATE LOCK RT SM 74X10.7X3.5X9 (Plate) ×1 IMPLANT
PLATE LOCK RT SM 88X10.9X2.5X9 (Plate) ×1 IMPLANT
SCREW CORT T15 24X3.5XST LCK (Screw) ×3 IMPLANT
SCREW CORTICAL 3.5X24MM (Screw) ×6 IMPLANT
SCREW CORTICAL LOW PROF 3.5X20 (Screw) ×2 IMPLANT
SCREW LOCK 3.5X20 DIST TIB (Screw) ×4 IMPLANT
SCREW LOCK 3.5X40 DIST TIB (Screw) ×2 IMPLANT
SCREW LOCK CORT STAR 3.5X12 (Screw) ×2 IMPLANT
SCREW LOCK CORT STAR 3.5X16 (Screw) ×4 IMPLANT
SCREW LOCK CORT STAR 3.5X36 (Screw) ×2 IMPLANT
SCREW LOW PROFILE 18MMX3.5MM (Screw) ×6 IMPLANT
SHEET MEDIUM DRAPE 40X70 STRL (DRAPES) ×2 IMPLANT
SLEEVE SCD COMPRESS KNEE MED (STOCKING) ×2 IMPLANT
SLING ARM FOAM STRAP LRG (SOFTGOODS) ×2 IMPLANT
SPLINT FAST PLASTER 5X30 (CAST SUPPLIES)
SPLINT PLASTER CAST FAST 5X30 (CAST SUPPLIES) IMPLANT
SPONGE LAP 18X18 RF (DISPOSABLE) ×4 IMPLANT
SUCTION FRAZIER HANDLE 10FR (MISCELLANEOUS)
SUCTION TUBE FRAZIER 10FR DISP (MISCELLANEOUS) IMPLANT
SUT MNCRL AB 4-0 PS2 18 (SUTURE) ×2 IMPLANT
SUT VIC AB 0 CT1 27 (SUTURE) ×4
SUT VIC AB 0 CT1 27XBRD ANBCTR (SUTURE) ×2 IMPLANT
SUT VIC AB 1 CT1 27 (SUTURE)
SUT VIC AB 1 CT1 27XBRD ANBCTR (SUTURE) IMPLANT
SUT VIC AB 2-0 CT1 27 (SUTURE)
SUT VIC AB 2-0 CT1 TAPERPNT 27 (SUTURE) IMPLANT
SUT VIC AB 2-0 SH 27 (SUTURE) ×2
SUT VIC AB 2-0 SH 27XBRD (SUTURE) ×1 IMPLANT
SUT VIC AB 3-0 SH 27 (SUTURE) ×4
SUT VIC AB 3-0 SH 27X BRD (SUTURE) ×2 IMPLANT
SYR BULB IRRIG 60ML STRL (SYRINGE) ×2 IMPLANT
TOWEL GREEN STERILE FF (TOWEL DISPOSABLE) ×4 IMPLANT
TUBE CONNECTING 20X1/4 (TUBING) ×2 IMPLANT
TUBE SUCTION HIGH CAP CLEAR NV (SUCTIONS) ×2 IMPLANT
WASHER 3.5MM (Orthopedic Implant) ×2 IMPLANT

## 2020-12-02 NOTE — Anesthesia Preprocedure Evaluation (Signed)
Anesthesia Evaluation  Patient identified by MRN, date of birth, ID band Patient awake    Reviewed: Allergy & Precautions, NPO status , Patient's Chart, lab work & pertinent test results, reviewed documented beta blocker date and time   Airway Mallampati: II  TM Distance: >3 FB Neck ROM: Full    Dental no notable dental hx. (+) Teeth Intact, Dental Advisory Given   Pulmonary neg pulmonary ROS,    Pulmonary exam normal breath sounds clear to auscultation       Cardiovascular hypertension, Pt. on medications Normal cardiovascular exam Rhythm:Regular Rate:Normal     Neuro/Psych  Headaches, Seizures -, Well Controlled,  PSYCHIATRIC DISORDERS Anxiety Depression Hx/o single Sz on no Rx    GI/Hepatic GERD  Medicated and Controlled,(+) Cirrhosis     substance abuse  alcohol use, Hx/o ETOH related cirrhosis 2012   Endo/Other  Obesity  Renal/GU negative Renal ROS  negative genitourinary   Musculoskeletal Supracondylar Fx right distal humerus   Abdominal (+) + obese,   Peds  Hematology negative hematology ROS (+)   Anesthesia Other Findings   Reproductive/Obstetrics                             Anesthesia Physical Anesthesia Plan  ASA: II  Anesthesia Plan: General   Post-op Pain Management:  Regional for Post-op pain   Induction: Intravenous  PONV Risk Score and Plan: 4 or greater and Treatment may vary due to age or medical condition, Midazolam, Dexamethasone, Ondansetron and Amisulpride  Airway Management Planned: Oral ETT  Additional Equipment:   Intra-op Plan:   Post-operative Plan: Extubation in OR  Informed Consent: I have reviewed the patients History and Physical, chart, labs and discussed the procedure including the risks, benefits and alternatives for the proposed anesthesia with the patient or authorized representative who has indicated his/her understanding and acceptance.      Dental advisory given  Plan Discussed with: CRNA and Anesthesiologist  Anesthesia Plan Comments:         Anesthesia Quick Evaluation

## 2020-12-02 NOTE — Progress Notes (Signed)
Assisted Dr. Foster with right, ultrasound guided, interscalene  block. Side rails up, monitors on throughout procedure. See vital signs in flow sheet. Tolerated Procedure well. 

## 2020-12-02 NOTE — Anesthesia Postprocedure Evaluation (Signed)
Anesthesia Post Note  Patient: [name]  Procedure(s) Performed: OPEN REDUCTION INTERNAL FIXATION (ORIF) DISTAL HUMERUS FRACTURE (Right Arm Upper)     Patient location during evaluation: PACU Anesthesia Type: General Level of consciousness: awake and alert and oriented Pain management: pain level controlled Vital Signs Assessment: post-procedure vital signs reviewed and stable Respiratory status: spontaneous breathing, nonlabored ventilation and respiratory function stable Cardiovascular status: blood pressure returned to baseline and stable Postop Assessment: no apparent nausea or vomiting Anesthetic complications: no   No complications documented.  Last Vitals:  Vitals:   12/02/20 1515 12/02/20 1600  BP: (!) 122/91 136/86  Pulse: 78 78  Resp: 10 16  Temp:  37.4 C  SpO2: 98% 100%    Last Pain:  Vitals:   12/02/20 1600  TempSrc:   PainSc: 7                  Giomar Gusler A.

## 2020-12-02 NOTE — Transfer of Care (Signed)
Immediate Anesthesia Transfer of Care Note  Patient: Sharona Rovner  Procedure(s) Performed: OPEN REDUCTION INTERNAL FIXATION (ORIF) DISTAL HUMERUS FRACTURE (Right Arm Upper)  Patient Location: PACU  Anesthesia Type:General and Regional  Level of Consciousness: drowsy  Airway & Oxygen Therapy: Patient Spontanous Breathing and Patient connected to face mask oxygen  Post-op Assessment: Report given to RN and Post -op Vital signs reviewed and stable  Post vital signs: Reviewed and stable  Last Vitals:  Vitals Value Taken Time  BP 125/78 12/02/20 1422  Temp    Pulse 88 12/02/20 1425  Resp    SpO2 100 % 12/02/20 1425  Vitals shown include unvalidated device data.  Last Pain:  Vitals:   12/02/20 1058  TempSrc: Oral  PainSc: 8       Patients Stated Pain Goal: 7 (12/02/20 1058)  Complications: No complications documented.

## 2020-12-02 NOTE — Anesthesia Procedure Notes (Signed)
Procedure Name: Intubation Performed by: Suellyn Meenan S, CRNA Pre-anesthesia Checklist: Patient identified, Emergency Drugs available, Suction available and Patient being monitored Patient Re-evaluated:Patient Re-evaluated prior to induction Oxygen Delivery Method: Circle System Utilized Preoxygenation: Pre-oxygenation with 100% oxygen Induction Type: IV induction Ventilation: Mask ventilation without difficulty Laryngoscope Size: Mac and 3 Grade View: Grade I Tube type: Oral Tube size: 7.0 mm Number of attempts: 1 Airway Equipment and Method: Stylet and Oral airway Placement Confirmation: ETT inserted through vocal cords under direct vision,  positive ETCO2 and breath sounds checked- equal and bilateral Secured at: 21 cm Tube secured with: Tape Dental Injury: Teeth and Oropharynx as per pre-operative assessment        

## 2020-12-02 NOTE — Interval H&P Note (Signed)
History and Physical Interval Note:  12/02/2020 11:45 AM  [name]  has presented today for surgery, with the diagnosis of RIGHT ELBOW SUPER CHONDIAL FRACTURE.  The various methods of treatment have been discussed with the patient and family. After consideration of risks, benefits and other options for treatment, the patient has consented to  Procedure(s): OPEN REDUCTION INTERNAL FIXATION (ORIF) DISTAL HUMERUS FRACTURE (Right) as a surgical intervention.  The patient's history has been reviewed, patient examined, no change in status, stable for surgery.  I have reviewed the patient's chart and labs.  Questions were answered to the patient's satisfaction.     Bjorn Pippin

## 2020-12-02 NOTE — Anesthesia Procedure Notes (Signed)
Anesthesia Regional Block: Interscalene brachial plexus block   Pre-Anesthetic Checklist: ,, timeout performed, Correct Patient, Correct Site, Correct Laterality, Correct Procedure, Correct Position, site marked, Risks and benefits discussed,  Surgical consent,  Pre-op evaluation,  At surgeon's request and post-op pain management  Laterality: Right  Prep: chloraprep       Needles:  Injection technique: Single-shot  Needle Type: Echogenic Stimulator Needle     Needle Length: 9cm  Needle Gauge: 21   Needle insertion depth: 6 cm   Additional Needles:   Procedures:,,,, ultrasound used (permanent image in chart),,,,  Narrative:  Start time: 12/02/2020 11:32 AM End time: 12/02/2020 11:37 AM Injection made incrementally with aspirations every 5 mL.  Performed by: Personally  Anesthesiologist: Mal Amabile, MD  Additional Notes: Timeout performed. Patient sedated. Relevant anatomy ID'd using [place]. Incremental 2-53ml injection of LA with frequent aspiration. Patient tolerated procedure well.        Right Interscalene Block

## 2020-12-02 NOTE — Discharge Instructions (Signed)
Post Anesthesia Home Care Instructions  Activity: Get plenty of rest for the remainder of the day. A responsible individual must stay with you for 24 hours following the procedure.  For the next 24 hours, DO NOT: -Drive a car -Advertising copywriter -Drink alcoholic beverages -Take any medication unless instructed by your physician -Make any legal decisions or sign important papers.  Meals: Start with liquid foods such as gelatin or soup. Progress to regular foods as tolerated. Avoid greasy, spicy, heavy foods. If nausea and/or vomiting occur, drink only clear liquids until the nausea and/or vomiting subsides. Call your physician if vomiting continues.  Special Instructions/Symptoms: Your throat may feel dry or sore from the anesthesia or the breathing tube placed in your throat during surgery. If this causes discomfort, gargle with warm salt water. The discomfort should disappear within 24 hours.  If you had a scopolamine patch placed behind your ear for the management of post- operative nausea and/or vomiting:  1. The medication in the patch is effective for 72 hours, after which it should be removed.  Wrap patch in a tissue and discard in the trash. Wash hands thoroughly with soap and water. 2. You may remove the patch earlier than 72 hours if you experience unpleasant side effects which may include dry mouth, dizziness or visual disturbances. 3. Avoid touching the patch. Wash your hands with soap and water after contact with the patch.     Regional Anesthesia Blocks  1. Numbness or the inability to move the "blocked" extremity may last from 3-48 hours after placement. The length of time depends on the medication injected and your individual response to the medication. If the numbness is not going away after 48 hours, call your surgeon.  2. The extremity that is blocked will need to be protected until the numbness is gone and the  Strength has returned. Because you cannot feel it, you  will need to take extra care to avoid injury. Because it may be weak, you may have difficulty moving it or using it. You may not know what position it is in without looking at it while the block is in effect.  3. For blocks in the legs and feet, returning to weight bearing and walking needs to be done carefully. You will need to wait until the numbness is entirely gone and the strength has returned. You should be able to move your leg and foot normally before you try and bear weight or walk. You will need someone to be with you when you first try to ensure you do not fall and possibly risk injury.  4. Bruising and tenderness at the needle site are common side effects and will resolve in a few days.  5. Persistent numbness or new problems with movement should be communicated to the surgeon or the [facility] [phone] [facility] [phone]).Information for Discharge Teaching: EXPAREL (bupivacaine liposome injectable suspension)   Your surgeon or anesthesiologist gave you EXPAREL(bupivacaine) to help control your pain after surgery.   EXPAREL is a local anesthetic that provides pain relief by numbing the tissue around the surgical site.  EXPAREL is designed to release pain medication over time and can control pain for up to 72 hours.  Depending on how you respond to EXPAREL, you may require less pain medication during your recovery.  Possible side effects:  Temporary loss of sensation or ability to move in the area where bupivacaine was injected.  Nausea, vomiting, constipation  Rarely, numbness and tingling  in your mouth or lips, lightheadedness, or anxiety may occur.  Call your doctor right away if you think you may be experiencing any of these sensations, or if you have other questions regarding possible side effects.  Follow all other discharge instructions given to you by your surgeon or nurse. Eat a healthy diet and drink plenty of water or other  fluids.  If you return to the hospital for any reason within 96 hours following the administration of EXPAREL, it is important for health care providers to know that you have received this anesthetic. A teal colored band has been placed on your arm with the date, time and amount of EXPAREL you have received in order to alert and inform your health care providers. Please leave this armband in place for the full 96 hours following administration, and then you may remove the band.        Ramond Marrow MD, MPH Alfonse Alpers, PA-C [facility] [address], [address] [phone] (tel)   [phone] (fax)   POST-OPERATIVE INSTRUCTIONS  WOUND CARE . You may remove the Operative Dressing on Post-Op Day #3 (72hrs after surgery).   . Alternatively if you would like you can leave dressing on until follow-up if within 7-8 days but keep it dry. . Leave steri-strips in place until they fall off on their own, usually 2 weeks postop. . There may be a small amount of fluid/bleeding leaking at the surgical site.  o This is normal . You may change/reinforce the bandage as needed.  . Use the Cryocuff or Ice as often as possible for the first 7 days, then as needed for pain relief. Always keep a towel, ACE wrap or other barrier between the cooling unit and your skin.  . You may shower on Post-Op Day #3. Gently pat the area dry.  . Do not soak the shoulder in water or submerge it. Keep dry incisions as dry as possible. . Do not go swimming in the pool or ocean until 4 weeks after surgery or when otherwise instructed.    EXERCISES/BRACING ? Sling should be used at all times until follow-up.  ? You can remove sling for showering/bathing.    o You can keep your elbow at a flexed position (like you are in the sling) or let it go straight next to your body - whichever one you feel more comfortable with ? Do not lift anything heavy with your operative arm . Please continue to ambulate  and do not stay sitting or lying for too long. Perform foot and wrist pumps to assist in circulation.  POST-OP MEDICATIONS- Multimodal approach to pain control . In general your pain will be controlled with a combination of substances.  Prescriptions unless otherwise discussed are electronically sent to your pharmacy.  This is a carefully made plan we use to minimize narcotic use.     ? Celebrex - Anti-inflammatory medication taken on a scheduled basis ? Take 1 tablet twice a day ? Acetaminophen - Non-narcotic pain medicine taken on a scheduled basis  - Take two 500 mg tablets (1,000 mg total) every 8 hours ? Gabapentin - this is a medication to help with pain, take on a scheduled basis - Take 1 tablet three times a day ? Oxycodone - This is a strong narcotic, to be used only on an "as needed" basis for pain. ? Robaxin - this is a muscle relaxer, take as needed for muscle spasms ? Zofran - take as needed  for nausea   FOLLOW-UP . If you develop a Fever (?101.5), Redness or Drainage from the surgical incision site, please call our office to arrange for an evaluation. . Please call the office to schedule a follow-up appointment, 7-10 days post-operatively.    HELPFUL INFORMATION  . If you had a block, it will wear off between 8-24 hrs postop typically.  This is period when your pain may go from nearly zero to the pain you would have had postop without the block.  This is an abrupt transition but nothing dangerous is happening.  You may take an extra dose of narcotic when this happens.  . You may be more comfortable sleeping in a semi-seated position the first few nights following surgery.  Keep a pillow propped under the elbow and forearm for comfort.  If you have a recliner type of chair it might be beneficial.  If not that is fine too, but it would be helpful to sleep propped up with pillows behind your elbow and forearm.  This will reduce pulling on the suture lines.  . When dressing, put  your operative arm in the sleeve first.  When getting undressed, take your operative arm out last.  Loose fitting, button-down shirts are recommended.  Often in the first days after surgery you may be more comfortable keeping your operative arm under your shirt and not through the sleeve.  . You may return to work/school in the next couple of days when you feel up to it.  Desk work and typing in the sling is fine.  . We suggest you use the pain medication the first night prior to going to bed, in order to ease any pain when the anesthesia wears off. You should avoid taking pain medications on an empty stomach as it will make you nauseous.  . You should wean off your narcotic medicines as soon as you are able.  Most patients will be off or using minimal narcotics before their first postop appointment.   . Do not drink alcoholic beverages or take illicit drugs when taking pain medications.  . It is against the law to drive while taking narcotics.  In some states it is against the law to drive while your arm is in a sling.   . Pain medication may make you constipated.  Below are a few solutions to try in this order: - Decrease the amount of pain medication if you aren't having pain. - Drink lots of decaffeinated fluids. - Drink prune juice and/or eat dried prunes  . If the first 3 don't work start with additional solutions - Take Colace - an over-the-counter stool softener - Take Senokot - an over-the-counter laxative - Take Miralax - a stronger over-the-counter laxative  For more information including helpful videos and documents visit our website:   [email or web address]

## 2020-12-03 ENCOUNTER — Encounter (HOSPITAL_BASED_OUTPATIENT_CLINIC_OR_DEPARTMENT_OTHER): Payer: Self-pay | Admitting: Orthopaedic Surgery

## 2020-12-03 NOTE — Op Note (Signed)
Orthopaedic Surgery Operative Note (CSN: [id number])  [name]  [birth year] Date of Surgery: 12/02/2020   Diagnoses:  RIGHT DISTAL HUMERUS FRACTURE  Procedure: Right supracondylar distal humerus fracture open external fixation Right medial humeral epicondyle open reduction internal fixation Right ulnar nerve decompression and neurolysis   Operative Finding Successful completion of the planned procedure.  Patient's bone quality was good and she had a relatively distal fracture however we were able to get 6 locking screws into the distal fragment with good purchase and anatomic reduction.  We did not feel that splinting was necessary.  Nerve was under significant tension preoperatively especially in light of the medial epicondyle fracture.  We were able to anatomically reduce this as well.  Ulnar nerve was decompressed and left in its normal position.  Post-operative plan: The patient will be nonweightbearing with a sling for comfort with range of motion therapy and 1 pound weight limit to start after her first visit.  The patient will be discharged home.  DVT prophylaxis not indicated in this ambulatory upper extremity patient without significant risk factors.   Pain control with PRN pain medication preferring oral medicines.  Follow up plan will be scheduled in approximately 7 days for incision check and XR.  Post-Op Diagnosis: Same Surgeons:Primary: Bjorn Pippin, MD Assistants:Caroline McBane PA-C Location: [facility] OR ROOM 6 Anesthesia: General with regional anesthesia Antibiotics: Ancef 2 g with local vancomycin powder 1 g at the surgical site Tourniquet time:  Total Tourniquet Time Documented: Upper Arm (Right) - 84 minutes Total: Upper Arm (Right) - 84 minutes  Estimated Blood Loss: Minimal Complications: None Specimens: None Implants: Implant Name Type Inv. Item Serial No. Manufacturer Lot No. LRB No. Used Action  PLATE LOCK RT SM - [id number] Plate PLATE LOCK RT SM  ZIMMER  RECON(ORTH,TRAU,BIO,SG) ON STERILE TRAY Right 1 Implanted  SCREW LOCK CORT STAR 3.5X12 - [id number] Screw SCREW LOCK CORT STAR 3.5X12  ZIMMER RECON(ORTH,TRAU,BIO,SG) ON STERILE TRAY Right 1 Implanted  SCREW LOCK CORT STAR 3.5X16 - [id number] Screw SCREW LOCK CORT STAR 3.5X16  ZIMMER RECON(ORTH,TRAU,BIO,SG) ON STERILE TRAY Right 2 Implanted  SCREW LOCK CORT STAR 3.5X36 - [id number] Screw SCREW LOCK CORT STAR 3.5X36  ZIMMER RECON(ORTH,TRAU,BIO,SG) ON STERILE TRAY Right 1 Implanted  SCREW LOCK 3.5X20 DIST TIB - [id number] Screw SCREW LOCK 3.5X20 DIST TIB  ZIMMER RECON(ORTH,TRAU,BIO,SG) ON STERILE TRAY Right 2 Implanted  SCREW LOCK 3.5X40 DIST TIB - [id number] Screw SCREW LOCK 3.5X40 DIST TIB  ZIMMER RECON(ORTH,TRAU,BIO,SG) ON STERILE TRAY Right 1 Implanted  WASHER 3.5MM - [id number] Orthopedic Implant WASHER 3.5MM  ZIMMER RECON(ORTH,TRAU,BIO,SG) ON STERILE TRAY Right 1 Implanted  SCREW LOW PROFILE 18MMX3. - [id number] Screw SCREW LOW PROFILE 18MMX3.  ZIMMER RECON(ORTH,TRAU,BIO,SG) ON STERILE TRAY Right 3 Implanted  SCREW CORTICAL LOW PROF 3.5X20 - [id number] Screw SCREW CORTICAL LOW PROF 3.5X20  ZIMMER RECON(ORTH,TRAU,BIO,SG) ON STERILE TRAY Right 1 Implanted  SCREW CORTICAL 3.5X24MM - [id number] Screw SCREW CORTICAL 3.5X24MM  ZIMMER RECON(ORTH,TRAU,BIO,SG) ON STERILE TRAY Right 3 Implanted  PLATE LOCK RT SM - [id number] Plate PLATE LOCK RT SM  ZIMMER RECON(ORTH,TRAU,BIO,SG) ON STERILE TRAY Right 1 Implanted    Indications for Surgery:   [name] is a 35 y.o. female with fall resulting in a complex distal humerus fracture and ulnar neuritis.  Benefits and risks of operative and nonoperative management were discussed prior to surgery with patient/guardian(s) and informed consent form was completed.  Specific risks including infection, need for additional surgery, nonunion, malunion, stiffness, heterotopic bone formation, neurovascular damage amongst  others   Procedure:   The patient was identified  properly. Informed consent was obtained and the surgical site was marked. The patient was taken up to suite where general anesthesia was induced.  The patient was positioned lateral on a beanbag with arm over a sure foot positioner.  The right elbow was prepped and draped in the usual sterile fashion.  Timeout was performed before the beginning of the case.  Tourniquet was used for the above duration.  We began with a longitudinal approach to posterior aspect of the humerus.  With the skin sharply achieving hemostasis we progressed.  We made full-thickness skin flaps identifying medial lateral borders of the humerus.  We identified the ulnar nerve proximally and noted there is significant tented over the medial epicondyle.  We were able to decompress and temporarily transpose the nerve releasing it proximal and distal to the elbow.  We did not sacrificed any branches to the nerve.  At this point we were able to use dual paratricipital windows to identify the fracture fragments.  The medial epicondyle was a separate fragment.  We provisionally held the fracture reduced with clamps as well as K wires noting anatomic reduction.  We then selected a Biomet Alps posterior lateral distal humerus plate and placed it under fluoroscopic guidance noting good purchase with 3 locking screws into the distal fragment and 3 nonlocking screws proximally.  We felt the backup fixation was necessary especially in light of the patient's epicondyle fracture.  We anatomic reduce the epicondyle to the distal fragment and held it provisionally with a K wire and then placed a direct medial plate.  We able to get 3 locking screws distal and 3 nonlocking proximal to the fracture.  We had an anatomic reduction overall on fluoroscopy with live views demonstrating no perforation of the joint surface.  Full range of motion was noted without impingement and no sign of articular grinding from any screw perforation.  Nerve was again checked to  ensure that it was in safe position was placed back in its normal bed.  Incisions were irrigated and the dead space was closed.  Local vancomycin powder was placed.  Skin was closed in a multilayer fashion with ruble suture.  Sling and sterile dressing was placed.  Patient was awoken taken to PACU in stable condition.  Alfonse Alpers, PA-C, present and scrubbed throughout the case, critical for completion in a timely fashion, and for retraction, instrumentation, closure.

## 2021-01-09 DIAGNOSIS — U071 COVID-19: Secondary | ICD-10-CM

## 2021-01-09 HISTORY — DX: COVID-19: U07.1

## 2021-03-01 ENCOUNTER — Encounter: Payer: Self-pay | Admitting: Emergency Medicine

## 2021-03-01 ENCOUNTER — Ambulatory Visit
Admission: EM | Admit: 2021-03-01 | Discharge: 2021-03-01 | Disposition: A | Discharge status: Home / Self Care | Payer: BC Managed Care – PPO

## 2021-03-01 DIAGNOSIS — H6693 Otitis media, unspecified, bilateral: Secondary | ICD-10-CM

## 2021-03-01 DIAGNOSIS — J0100 Acute maxillary sinusitis, unspecified: Secondary | ICD-10-CM | POA: Diagnosis not present

## 2021-03-01 MED ORDER — AMOXICILLIN 875 MG PO TABS: 875.0000 mg | ORAL_TABLET | Freq: Two times a day (BID) | ORAL | 0 refills | Status: AC

## 2021-03-01 NOTE — Discharge Instructions (Addendum)
Take the amoxicillin as directed.  Follow up with your primary care provider if your symptoms are not improving.   ° ° °

## 2021-03-01 NOTE — ED Provider Notes (Signed)
[name]    CSN: [id number] Arrival date & time: 03/01/21  5366      History   Chief Complaint Chief Complaint  Patient presents with   Nasal Congestion   Headache   Cough    HPI [name] is a 35 y.o. female.  Patient presents with 6-day history of sinus pressure, nasal congestion, postnasal drip, sinus headache, cough.  Treatment at home with OTC sinus medication.  She denies fever, chills, rash, shortness of breath, vomiting, diarrhea, or other symptoms.  Her medical history includes COVID-19 (tested positive at home 6 weeks ago), hypertension, frequent headaches, seizures, alcoholic cirrhosis of liver.  The history is provided by the patient and medical records.   Past Medical History:  Diagnosis Date   Alcoholic cirrhosis of liver (HCC)    12/2011   Allergy    Anxiety    COVID-19 01/2021   Depression    Ectopic pregnancy    Frequent headaches    GERD (gastroesophageal reflux disease)    Hypertension    no issue since 2011   Seizures (HCC)    x1 in 2012    Patient Active Problem List   Diagnosis Date Noted   History of abnormal cervical Pap smear 04/01/2019   History of ectopic pregnancy 04/01/2019   Migraine with aura and without status migrainosus, not intractable 04/01/2019    Past Surgical History:  Procedure Laterality Date   CHOLECYSTECTOMY  2015   COLPOSCOPY     DIAGNOSTIC LAPAROSCOPY WITH REMOVAL OF ECTOPIC PREGNANCY Right 02/13/2017   Procedure: DIAGNOSTIC LAPAROSCOPY WITH REMOVAL OF ECTOPIC PREGNANCY, right salpingectomy;  Surgeon: Linzie Collin, MD;  Location: [facility];  Service: Gynecology;  Laterality: Right;   ELBOW SURGERY Right 11/2020   ORIF HUMERUS FRACTURE Right 12/02/2020   Procedure: OPEN REDUCTION INTERNAL FIXATION (ORIF) DISTAL HUMERUS FRACTURE;  Surgeon: Bjorn Pippin, MD;  Location: [facility];  Service: Orthopedics;  Laterality: Right;    OB History     Gravida  2   Para  1   Term  1    Preterm  0   AB  1   Living  1      SAB  0   IAB  0   Ectopic  1   Multiple  0   Live Births  1            Home Medications    Prior to Admission medications   Medication Sig Start Date End Date Taking? Authorizing Provider  amoxicillin (AMOXIL) 875 MG tablet Take 1 tablet (875 mg total) by mouth 2 (two) times daily for 7 days. 03/01/21 03/08/21 Yes Mickie Bail, NP  dextromethorphan 15 MG/5ML syrup Take 10 mLs by mouth 4 (four) times daily as needed for cough.   Yes [provider]  gabapentin (NEURONTIN) 100 MG capsule Take 1 capsule (100 mg total) by mouth 3 (three) times daily. 12/02/20 01/01/21  McBane, Jerald Kief, PA-C  NURTEC 75 MG TBDP Take 75 mg by mouth daily as needed (migraine headache). Max 1 tablet in 24 hours. 11/19/20   Smitty Cords, DO    Family History Family History  Problem Relation Age of Onset   Alcohol abuse Mother    Alcohol abuse Father     Social History Social History   Tobacco Use   Smoking status: Never   Smokeless tobacco: Never  Vaping Use   Vaping Use: Never used  Substance Use Topics  Alcohol use: Yes    Comment: occasionally twice a week   Drug use: No     Allergies   Bupropion   Review of Systems Review of Systems  Constitutional:  Negative for chills and fever.  HENT:  Positive for congestion, ear pain, postnasal drip, rhinorrhea and sinus pressure. Negative for sore throat.   Respiratory:  Negative for cough and shortness of breath.   Cardiovascular:  Negative for chest pain and palpitations.  Gastrointestinal:  Negative for abdominal pain, diarrhea and vomiting.  Skin:  Negative for color change and rash.  Neurological:  Positive for headaches. Negative for dizziness, seizures and syncope.  All other systems reviewed and are negative.   Physical Exam Triage Vital Signs ED Triage Vitals  Enc Vitals Group     BP 03/01/21 0911 129/84     Pulse Rate 03/01/21 0911 76     Resp 03/01/21  0911 18     Temp 03/01/21 0911 98.5 F (36.9 C)     Temp Source 03/01/21 0911 Oral     SpO2 03/01/21 0911 96 %     Weight --      Height --      Head Circumference --      Peak Flow --      Pain Score 03/01/21 0916 6     Pain Loc --      Pain Edu? --      Excl. in GC? --    No data found.  Updated Vital Signs BP 129/84 (BP Location: Left Arm)   Pulse 76   Temp 98.5 F (36.9 C) (Oral)   Resp 18   LMP 02/16/2021 (Exact Date)   SpO2 96%   Visual Acuity Right Eye Distance:   Left Eye Distance:   Bilateral Distance:    Right Eye Near:   Left Eye Near:    Bilateral Near:     Physical Exam Vitals and nursing note reviewed.  Constitutional:      General: She is not in acute distress.    Appearance: She is well-developed.  HENT:     Head: Normocephalic and atraumatic.     Right Ear: Tympanic membrane is erythematous.     Left Ear: Tympanic membrane is erythematous.     Nose: Congestion present.     Mouth/Throat:     Mouth: Mucous membranes are moist.     Pharynx: Oropharynx is clear.  Eyes:     Conjunctiva/sclera: Conjunctivae normal.  Cardiovascular:     Rate and Rhythm: Normal rate and regular rhythm.     Heart sounds: No murmur heard. Pulmonary:     Effort: Pulmonary effort is normal. No respiratory distress.     Breath sounds: Normal breath sounds.  Abdominal:     Palpations: Abdomen is soft.     Tenderness: There is no abdominal tenderness.  Musculoskeletal:     Cervical back: Neck supple.  Skin:    General: Skin is warm and dry.  Neurological:     General: No focal deficit present.     Mental Status: She is alert and oriented to person, place, and time.     Gait: Gait normal.  Psychiatric:        Mood and Affect: Mood normal.        Behavior: Behavior normal.     UC Treatments / Results  Labs (all labs ordered are listed, but only abnormal results are displayed) Labs Reviewed - No data to display  EKG  Radiology No results  found.  Procedures Procedures (including critical care time)  Medications Ordered in UC Medications - No data to display  Initial Impression / Assessment and Plan / UC Course  I have reviewed the triage vital signs and the nursing notes.  Pertinent labs & imaging results that were available during my care of the patient were reviewed by me and considered in my medical decision making (see chart for details).  Acute sinusitis, bilateral otitis media.  No COVID test indicated today as patient was positive for COVID 6 weeks ago.  Treating with amoxicillin.  Instructed patient to follow-up with her PCP if her symptoms are not improving.  She agrees to plan of care.   Final Clinical Impressions(s) / UC Diagnoses   Final diagnoses:  Acute non-recurrent maxillary sinusitis  Bilateral otitis media, unspecified otitis media type     Discharge Instructions      Take the amoxicillin as directed.    Follow up with your primary care provider if your symptoms are not improving.        ED Prescriptions     Medication Sig Dispense Auth. Provider   amoxicillin (AMOXIL) 875 MG tablet Take 1 tablet (875 mg total) by mouth 2 (two) times daily for 7 days. 14 tablet Mickie Bail, NP      PDMP not reviewed this encounter.   Mickie Bail, NP 03/01/21 [phone]

## 2021-03-01 NOTE — ED Triage Notes (Signed)
Pt presents today with nasal congestion, headache and cough x 6 days. Reports having a fever on day one. She has been taking Robitussin Max, last this 7a.m.

## 2021-05-04 ENCOUNTER — Other Ambulatory Visit: Payer: Self-pay | Admitting: Family Medicine

## 2021-05-04 DIAGNOSIS — G43109 Migraine with aura, not intractable, without status migrainosus: Secondary | ICD-10-CM

## 2021-05-04 NOTE — Telephone Encounter (Signed)
Requested medication (s) are due for refill today -yes  Requested medication (s) are on the active medication list -yes  Future visit scheduled -no  Last refill: 11/19/20 #8 5RF  Notes to clinic: Request RF: Medication not assigned protocol   Requested Prescriptions  Pending Prescriptions Disp Refills   NURTEC 75 MG TBDP 8 tablet 5    Sig: Take 75 mg by mouth daily as needed (migraine headache). Max 1 tablet in 24 hours.     Off-Protocol Failed - 05/04/2021  2:24 PM      Failed - Medication not assigned to a protocol, review manually.      Passed - Valid encounter within last 12 months    Recent Outpatient Visits           9 months ago Migraine with aura and without status migrainosus, not intractable   [facility] [place], Netta Neat, DO   1 year ago Annual physical exam   [facility] [place], Netta Neat, DO   2 years ago Abnormal uterine bleeding (AUB)   [facility] Smitty Cords, DO                 Requested Prescriptions  Pending Prescriptions Disp Refills   NURTEC 75 MG TBDP 8 tablet 5    Sig: Take 75 mg by mouth daily as needed (migraine headache). Max 1 tablet in 24 hours.     Off-Protocol Failed - 05/04/2021  2:24 PM      Failed - Medication not assigned to a protocol, review manually.      Passed - Valid encounter within last 12 months    Recent Outpatient Visits           9 months ago Migraine with aura and without status migrainosus, not intractable   [facility] [place], Netta Neat, DO   1 year ago Annual physical exam   [facility] Smitty Cords, DO   2 years ago Abnormal uterine bleeding (AUB)   [facility] [place], Netta Neat, DO

## 2021-05-04 NOTE — Telephone Encounter (Signed)
Medication Refill - Medication: NURTEC 75 MG TBDP   Has the patient contacted their pharmacy? Yes.   (Agent: If no, request that the patient contact the pharmacy for the refill.) (Agent: If yes, when and what did the pharmacy advise?)  Preferred Pharmacy (with phone number or street name):  [facility], [place] (New Address) - [place], [place] - [address] AT Previously: [address], [place]  [address] [place] [address]  Phone: [phone] Fax: [phone]    Agent: Please be advised that RX refills may take up to 3 business days. We ask that you follow-up with your pharmacy.

## 2021-05-05 MED ORDER — NURTEC 75 MG PO TBDP: 75.0000 mg | ORAL_TABLET | Freq: Every day | ORAL | 2 refills | Status: DC | PRN

## 2021-07-12 ENCOUNTER — Other Ambulatory Visit: Payer: Self-pay | Admitting: Family Medicine

## 2021-07-12 DIAGNOSIS — G43109 Migraine with aura, not intractable, without status migrainosus: Secondary | ICD-10-CM

## 2021-07-12 NOTE — Telephone Encounter (Signed)
Medication Refill - Medication: Nurtec  Has the patient contacted their pharmacy? Yes.   Pharmacy called stating that the pt does not have refills on this prescription and is requesting to have refills sent in.Please advise.  (Agent: If no, request that the patient contact the pharmacy for the refill. If patient does not wish to contact the pharmacy document the reason why and proceed with request.) (Agent: If yes, when and what did the pharmacy advise?)  Preferred Pharmacy (with phone number or street name):  [facility] [place] (New Address) - [place], [place] - [address] AT Previously: [address], [place]  [address] 4[address] Floor [address] [place] [address]  Phone: [phone] Fax: [phone]  Hours: Not open 24 hours   Has the patient been seen for an appointment in the last year OR does the patient have an upcoming appointment? Yes.  Agent: Please be advised that RX refills may take up to 3 business days. We ask that you follow-up with your pharmacy.

## 2021-07-12 NOTE — Telephone Encounter (Signed)
Requested medication (s) are due for refill today - yes  Requested medication (s) are on the active medication list -yes  Future visit scheduled -no  Last refill: 05/05/21 #8 2RF  Notes to clinic: Request RF: medication not assigned protocol  Requested Prescriptions  Pending Prescriptions Disp Refills   NURTEC 75 MG TBDP 8 tablet 2    Sig: Take 75 mg by mouth daily as needed (migraine headache). Max 1 tablet in 24 hours.     Off-Protocol Failed - 07/12/2021  3:49 PM      Failed - Medication not assigned to a protocol, review manually.      Passed - Valid encounter within last 12 months    Recent Outpatient Visits           11 months ago Migraine with aura and without status migrainosus, not intractable   [facility] [place], Netta Neat, DO   2 years ago Annual physical exam   [facility] [place], Netta Neat, DO   2 years ago Abnormal uterine bleeding (AUB)   [facility] Smitty Cords, DO                 Requested Prescriptions  Pending Prescriptions Disp Refills   NURTEC 75 MG TBDP 8 tablet 2    Sig: Take 75 mg by mouth daily as needed (migraine headache). Max 1 tablet in 24 hours.     Off-Protocol Failed - 07/12/2021  3:49 PM      Failed - Medication not assigned to a protocol, review manually.      Passed - Valid encounter within last 12 months    Recent Outpatient Visits           11 months ago Migraine with aura and without status migrainosus, not intractable   [facility] [place], Netta Neat, DO   2 years ago Annual physical exam   [facility] [place], [place], DO   2 years ago Abnormal uterine bleeding (AUB)   [facility] Smitty Cords, DO                s

## 2021-07-13 MED ORDER — NURTEC 75 MG PO TBDP: 75.0000 mg | ORAL_TABLET | Freq: Every day | ORAL | 2 refills | Status: DC | PRN

## 2022-01-21 IMAGING — XA DG C-ARM 1-60 MIN
1 series · 5 of 5 positions shown · non-contrast
Comparison: November 28, 2020

CLINICAL DATA: Elective surgery.  ORIF of the elbow.

EXAM:
DG C-ARM 1-60 MIN
FLUOROSCOPY TIME:  Fluoroscopy Time:  1 minutes 23 seconds
Radiation Exposure Index (if provided by the fluoroscopic device):
4.01 mGy
Number of Acquired Spot Images: 5

[Series 1: unknown protocol · 0.30mm/px · 5 of 5 slices shown]
[im 1/5]
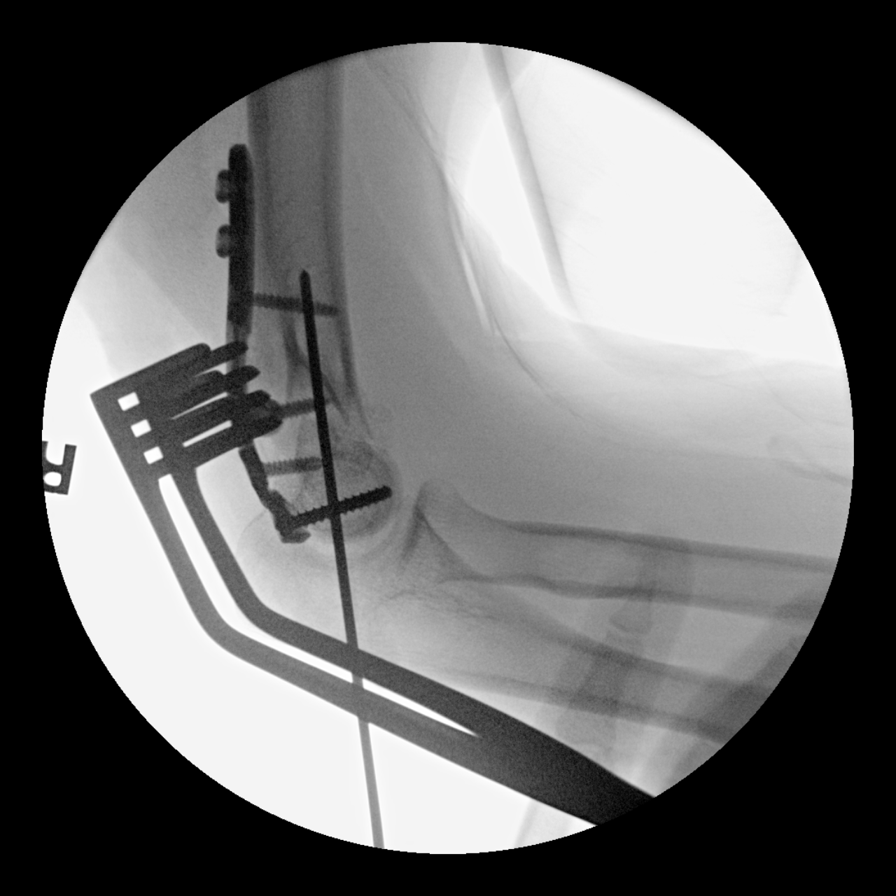
[im 2/5]
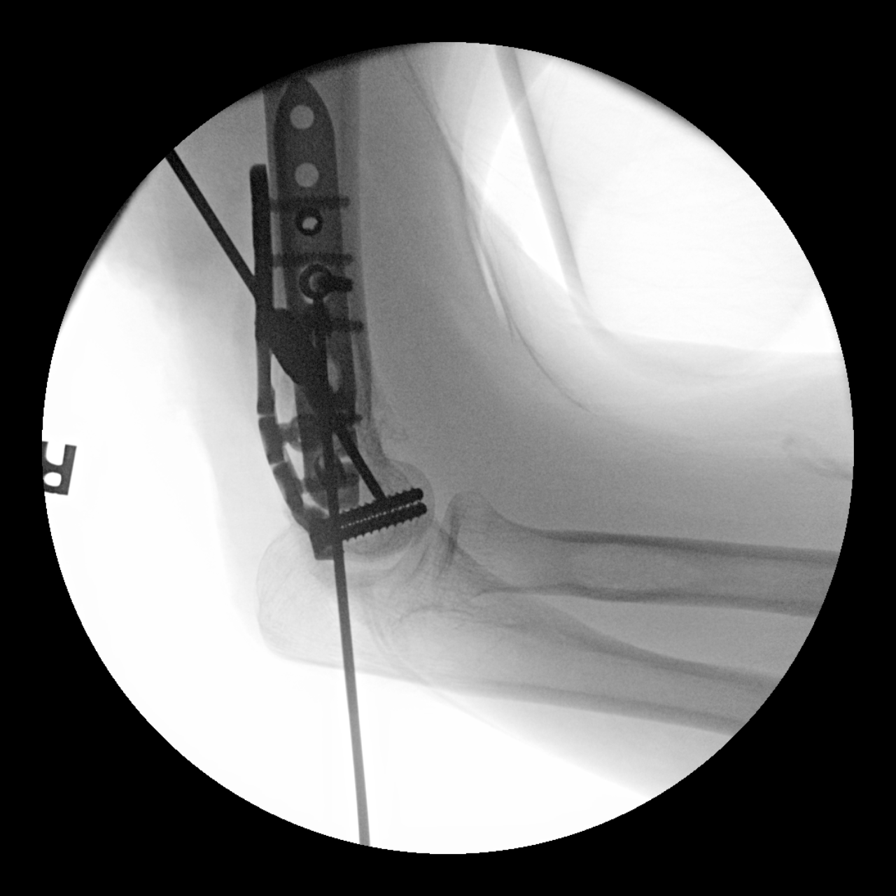
[im 3/5]
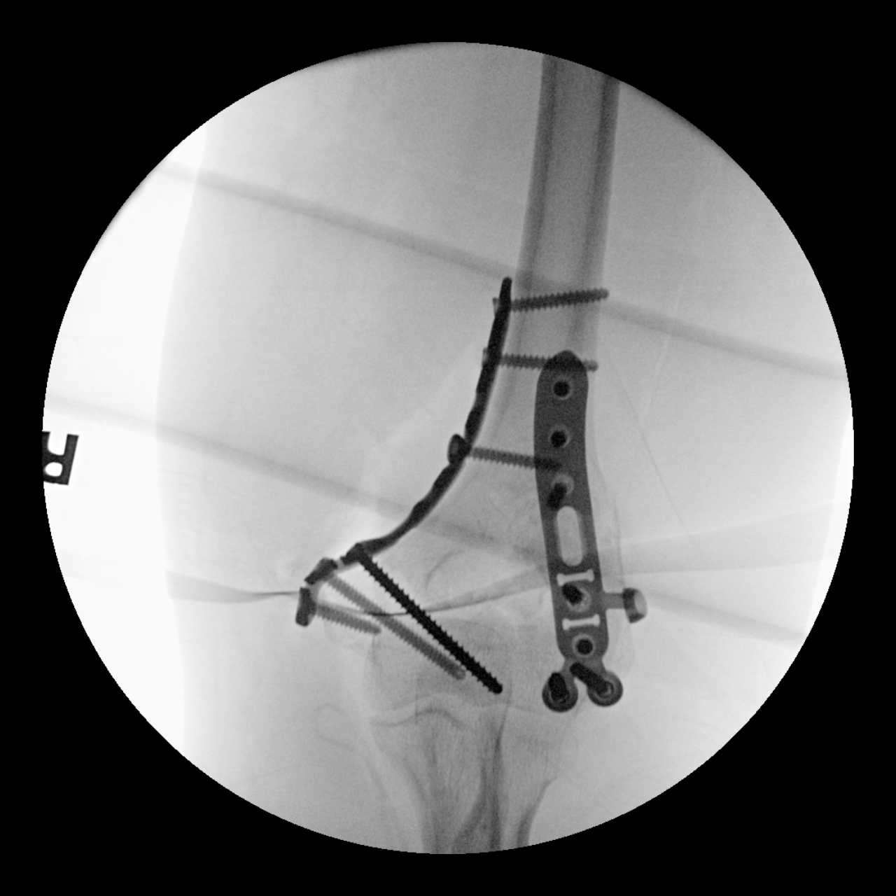
[im 4/5]
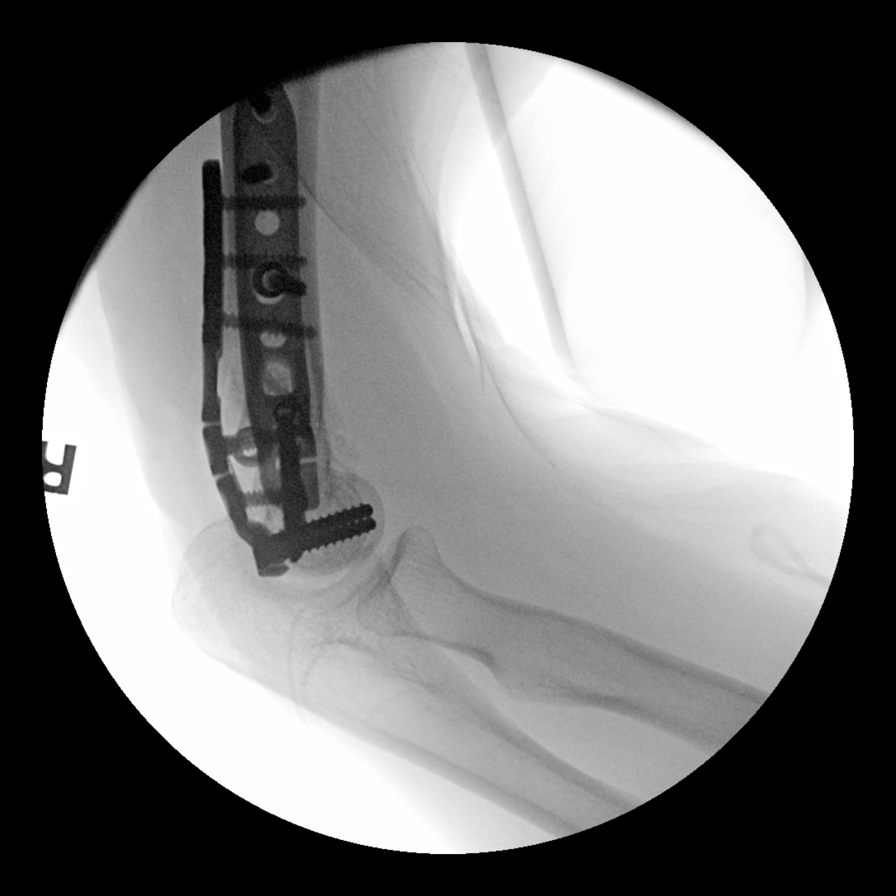
[im 5/5]
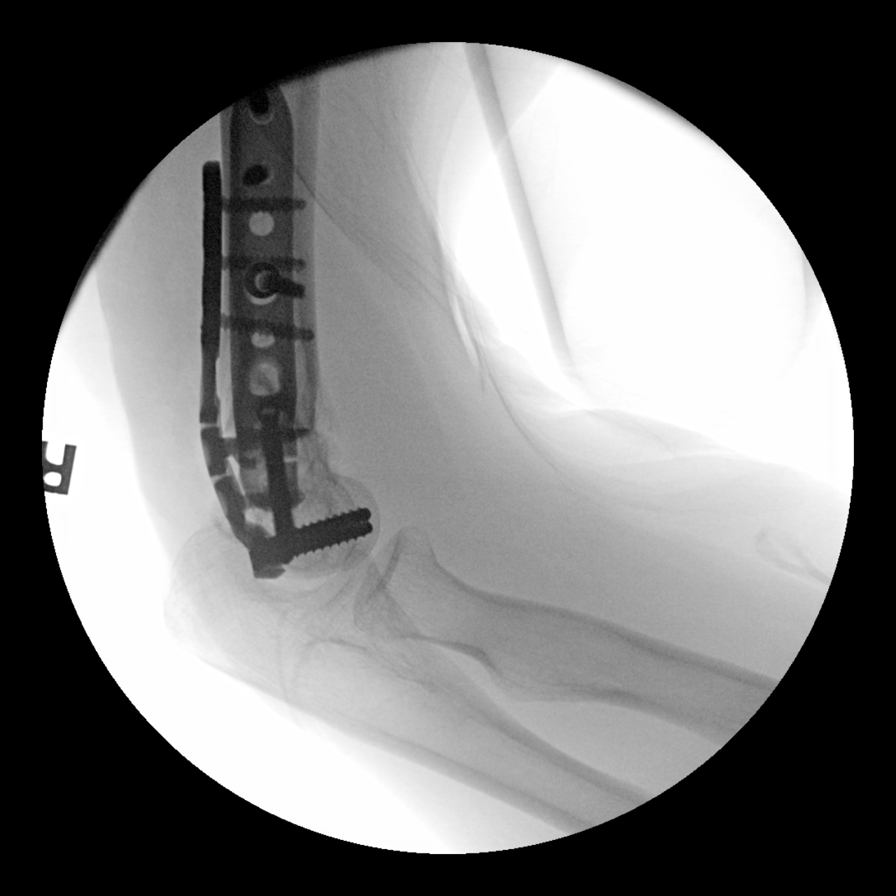

[5 of 5 positions shown; findings below may reference images not displayed]

FINDINGS: The patient has undergone ORIF of the distal humerus. The hardware
is intact. The osseous alignment is improved. There are expected
postsurgical changes of the elbow.
IMPRESSION: Expected postsurgical changes of the distal humerus.

## 2022-01-21 IMAGING — CR DG ELBOW 2V*R*
3 series · 3 of 3 positions shown · non-contrast
Comparison: November 28, 2020

CLINICAL DATA: ORIF the elbow

EXAM:
RIGHT ELBOW - 2 VIEW

[elbow ap (1 of 2)]
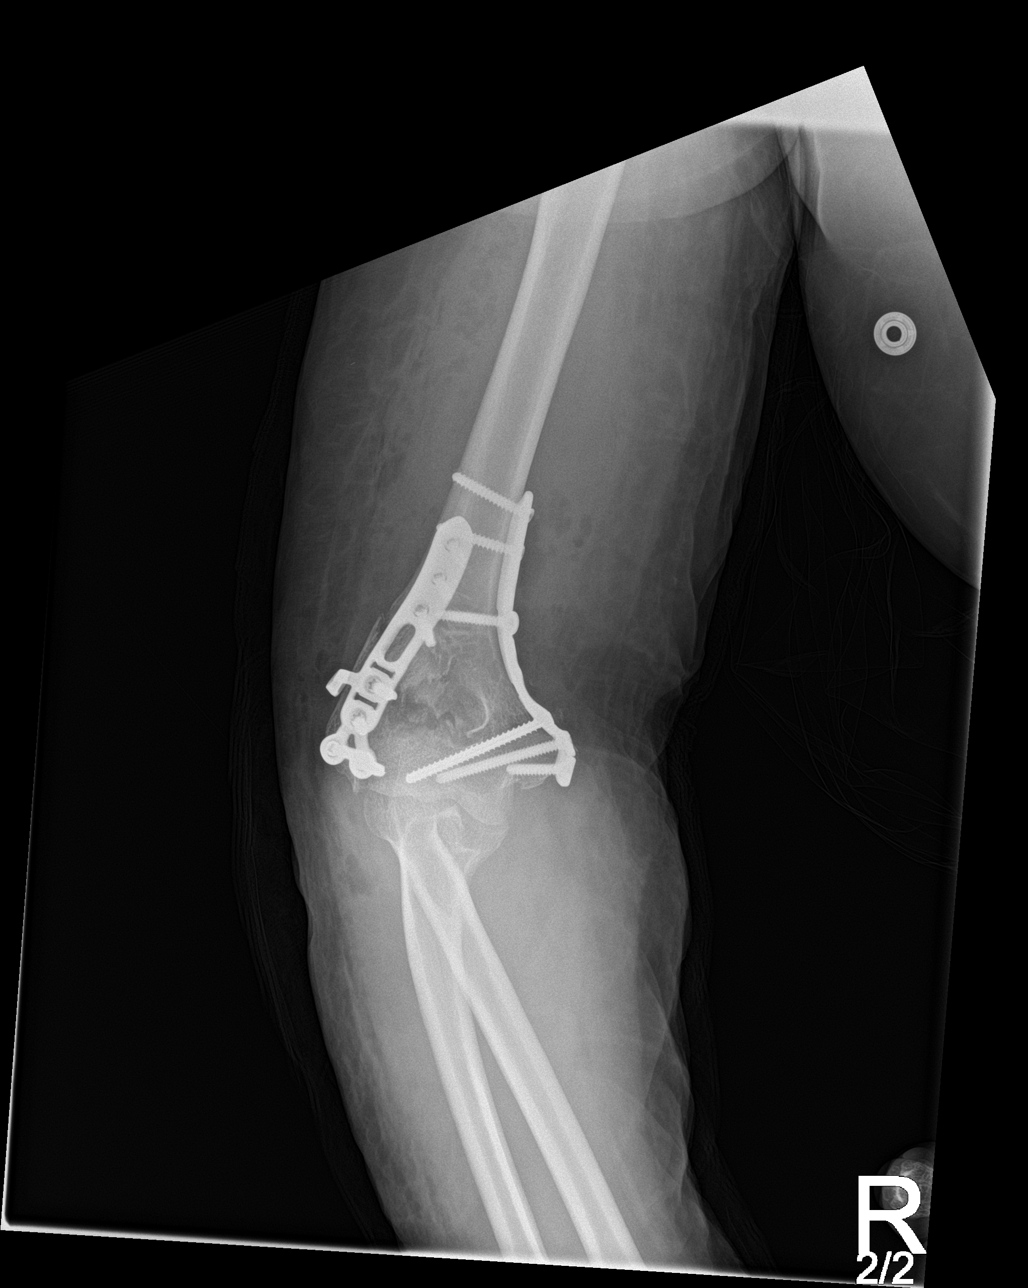

[elbow lat]
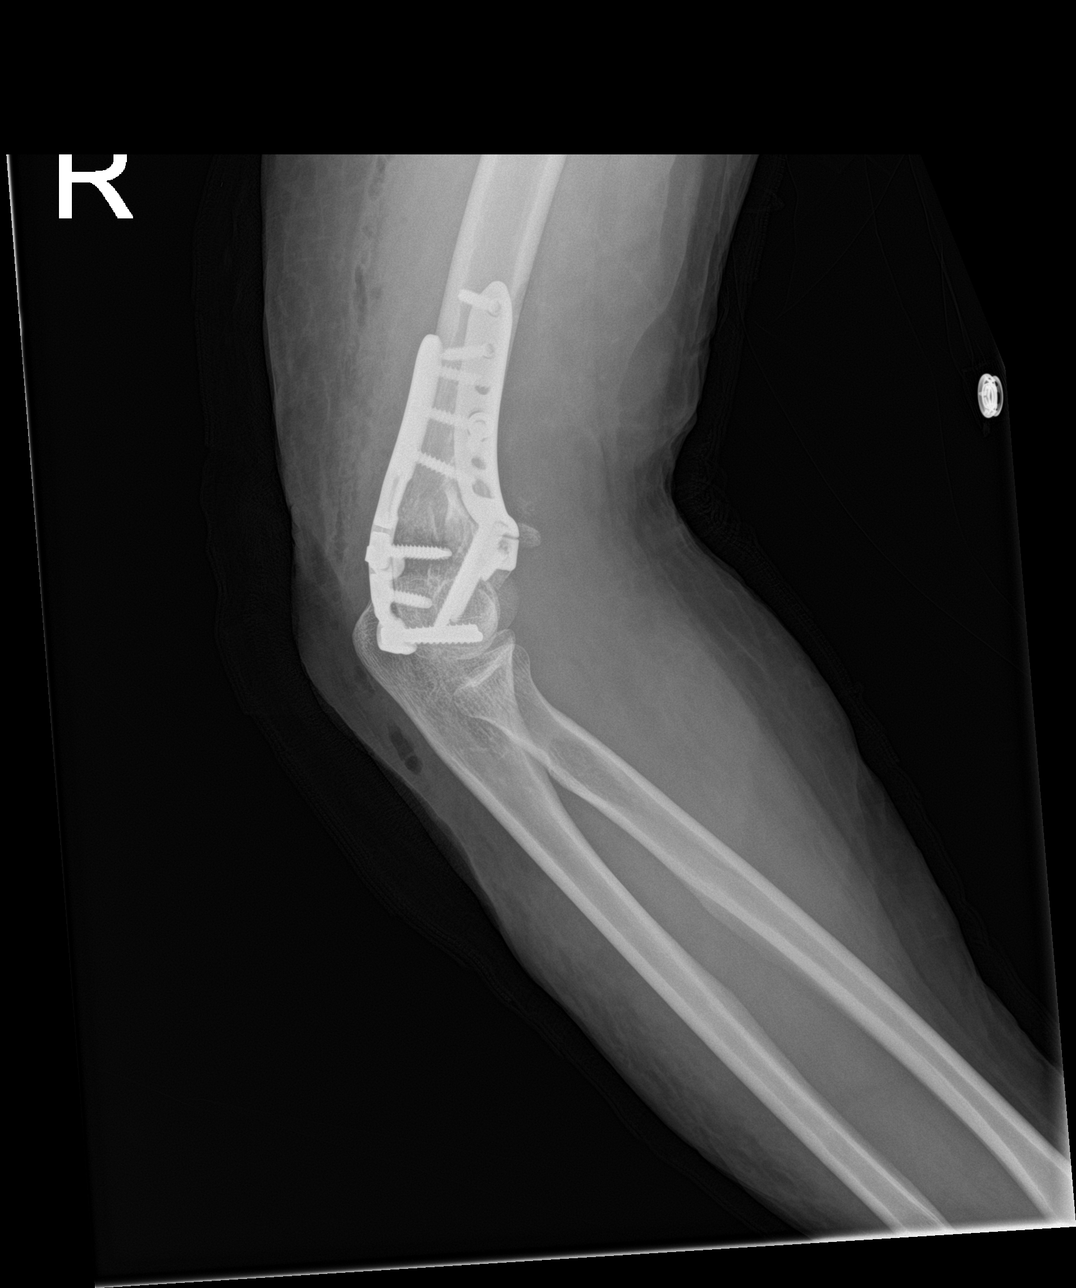

[elbow ap (2 of 2)]
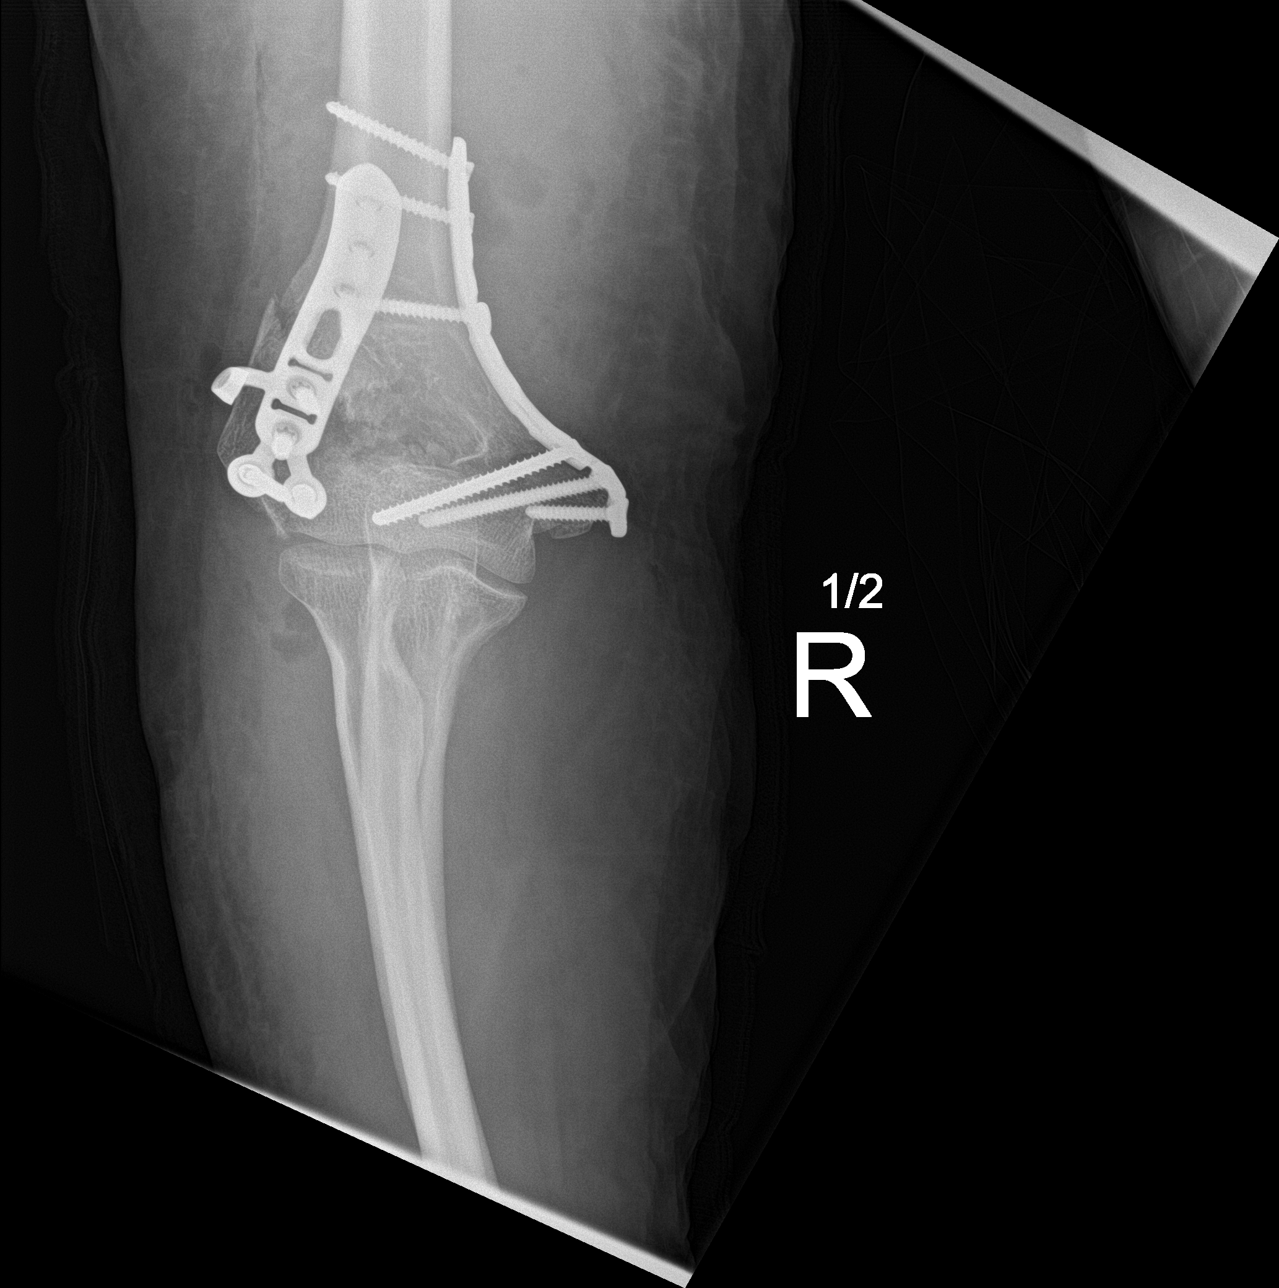

[3 of 3 positions shown; findings below may reference images not displayed]

FINDINGS: The patient has undergone ORIF of the distal humerus. The hardware
is intact. The osseous alignment is improved. There are expected
postsurgical changes of the elbow.
IMPRESSION: Status post ORIF of the distal humerus with improved osseous
alignment.

## 2022-02-22 ENCOUNTER — Encounter: Payer: 59 | Admitting: Nurse Practitioner

## 2022-04-11 HISTORY — PX: OTHER SURGICAL HISTORY: SHX169

## 2022-04-19 ENCOUNTER — Encounter: Payer: Self-pay | Admitting: Family Medicine

## 2022-04-19 ENCOUNTER — Ambulatory Visit: Payer: Self-pay | Admitting: *Deleted

## 2022-04-19 ENCOUNTER — Ambulatory Visit (INDEPENDENT_AMBULATORY_CARE_PROVIDER_SITE_OTHER): Payer: Medicaid Other | Admitting: Family Medicine

## 2022-04-19 VITALS — BP 134/86 | HR 76 | Temp 96.8°F | Wt 235.0 lb

## 2022-04-19 DIAGNOSIS — F331 Major depressive disorder, recurrent, moderate: Secondary | ICD-10-CM | POA: Diagnosis not present

## 2022-04-19 DIAGNOSIS — F411 Generalized anxiety disorder: Secondary | ICD-10-CM

## 2022-04-19 DIAGNOSIS — G43109 Migraine with aura, not intractable, without status migrainosus: Secondary | ICD-10-CM

## 2022-04-19 DIAGNOSIS — F4310 Post-traumatic stress disorder, unspecified: Secondary | ICD-10-CM | POA: Diagnosis not present

## 2022-04-19 MED ORDER — ESCITALOPRAM OXALATE 10 MG PO TABS: 10.0000 mg | ORAL_TABLET | Freq: Every day | ORAL | 2 refills | Status: DC

## 2022-04-19 MED ORDER — UBRELVY 100 MG PO TABS
ORAL_TABLET | ORAL | 5 refills | Status: DC
Start: 2022-04-19 — End: 2022-04-25

## 2022-04-19 MED ORDER — BUSPIRONE HCL 5 MG PO TABS: 5.0000 mg | ORAL_TABLET | Freq: Three times a day (TID) | ORAL | 2 refills | Status: DC | PRN

## 2022-04-19 NOTE — Patient Instructions (Addendum)
Thank you for coming to the office today.  Stop Nurtec ODT STart Ubrelvy, instructions as ordered, 16 per pack, use up to twice in a day if severe migraine.  Start Escitalopram lexapro 10mg  daily, you can do half tab for 5mg  for first 1-2 weeks if you prefer, then up to 10mg  daily, eventually after 3-4 weeks if need can adjust to 20mg   Buspar as needed for anxiety, shorter acting, up to 3 doses per day if truly needed, can take prior to stressful events etc.  In future if need something else for sleep we can discuss  If you can arrange Psychiatry visit that would work, otherwise I can place referral, let me know.   These offices have both PSYCHIATRY doctors and THERAPISTS  [facility] (Virtual Available) [place] [address] [place], [place] Phone: [phone]  [facility] Address: [address] [place], [address] [phone] [email or web address] Phone: [phone]  [facility]) Address: [address], [place], [facility] Hours: 8:30AM-5PM Phone: [phone]  [facility] (Adult, Peds, Geriatric, Counseling) [address] [place], [address] [phone] Phone: [phone] Fax: [phone]  [facility] [address] [place], [facility] Phone: [phone]  [facility] (All ages) [address], [address] [place] [facility] [place], [place] Phone: [phone] (Option 1) [email or web address]  ----------------------------------------------------------------- THERAPIST ONLY  (No Psychiatry)  [facility] [address] [place], [id number] [phone] [place] P: [phone]  [facility]) [place] [facility], [facility] [address] [place], [facility] [phone]  [facility]   Address: [address] [place], [phone] [facility] Hours: Open today  9AM-7PM Phone: [phone]  [facility] [address], [facility]  - Wellness Center Address: [address] [phone] [place], [facility] [address] Phone: [phone]      Please schedule a Follow-up Appointment to: Return if symptoms worsen or fail to improve.  If you have any other questions or concerns, please feel free to call the office or send a message through MyChart. You may also schedule an earlier appointment if necessary.  Additionally, you may be receiving a survey about your experience at our office within a few days to 1 week by e-mail or mail. We value your feedback.  [place], DO [facility], [address]
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
## 2022-04-21 ENCOUNTER — Ambulatory Visit: Payer: Self-pay | Admitting: Family Medicine

## 2022-04-25 ENCOUNTER — Encounter: Payer: Self-pay | Admitting: Family Medicine

## 2022-04-25 ENCOUNTER — Other Ambulatory Visit: Payer: Self-pay

## 2022-04-25 ENCOUNTER — Ambulatory Visit: Payer: Self-pay | Admitting: Family Medicine

## 2022-04-25 DIAGNOSIS — G43109 Migraine with aura, not intractable, without status migrainosus: Secondary | ICD-10-CM

## 2022-04-25 MED ORDER — UBRELVY 100 MG PO TABS: ORAL_TABLET | ORAL | 5 refills | Status: AC

## 2022-06-13 ENCOUNTER — Other Ambulatory Visit: Payer: Self-pay | Admitting: Family Medicine

## 2022-06-13 DIAGNOSIS — F331 Major depressive disorder, recurrent, moderate: Secondary | ICD-10-CM

## 2022-06-13 DIAGNOSIS — F411 Generalized anxiety disorder: Secondary | ICD-10-CM

## 2022-06-13 DIAGNOSIS — F4310 Post-traumatic stress disorder, unspecified: Secondary | ICD-10-CM

## 2022-06-13 NOTE — Telephone Encounter (Signed)
Requested Prescriptions  Pending Prescriptions Disp Refills  . escitalopram (LEXAPRO) 10 MG tablet [Pharmacy Med Name: ESCITALOPRAM 10MG  TABLETS] 30 tablet 2    Sig: TAKE 1 TABLET(10 MG) BY MOUTH DAILY     Psychiatry:  Antidepressants - SSRI Passed - 06/13/2022  8:00 AM      Passed - Valid encounter within last 6 months    Recent Outpatient Visits          1 month ago PTSD (post-traumatic stress disorder)   [place], DO   1 year ago Migraine with aura and without status migrainosus, not intractable   [place], DO   3 years ago Annual physical exam   [place], DO   3 years ago Abnormal uterine bleeding (AUB)   [place], Devonne Doughty, DO

## 2022-07-10 ENCOUNTER — Other Ambulatory Visit: Payer: Self-pay | Admitting: Family Medicine

## 2022-07-10 DIAGNOSIS — G43109 Migraine with aura, not intractable, without status migrainosus: Secondary | ICD-10-CM

## 2022-07-27 ENCOUNTER — Ambulatory Visit
Admission: RE | Admit: 2022-07-27 | Discharge: 2022-07-27 | Disposition: A | Discharge status: Home / Self Care | Payer: Medicaid Other | Source: Ambulatory Visit | Attending: Family Medicine | Admitting: Family Medicine

## 2022-07-27 DIAGNOSIS — G43109 Migraine with aura, not intractable, without status migrainosus: Secondary | ICD-10-CM

## 2022-07-27 MED ORDER — GADOBENATE DIMEGLUMINE 529 MG/ML IV SOLN
20.0000 mL | Freq: Once | INTRAVENOUS | Status: AC | PRN
  Administered 2022-07-27: 20 mL via INTRAVENOUS

## 2022-10-01 ENCOUNTER — Other Ambulatory Visit: Payer: Self-pay | Admitting: Family Medicine

## 2022-10-01 DIAGNOSIS — G43109 Migraine with aura, not intractable, without status migrainosus: Secondary | ICD-10-CM

## 2022-10-02 NOTE — Telephone Encounter (Signed)
Requested medication (s) are due for refill today:yes  Requested medication (s) are on the active medication list: yes  Last refill:  04/25/22  Future visit scheduled: {yes  Notes to clinic:  Medication not assigned to a protocol, review manually.      Requested Prescriptions  Pending Prescriptions Disp Refills   UBRELVY 100 MG TABS [Pharmacy Med Name: UBRELVY 100MG  TABLETS] 16 tablet 5    Sig: TAKE 1 TABLET BY MOUTH AS A SINGLE DOSE. A SECOND DOSE MAY BE TAKEN AT LEAST 2 HOURS AFTER THE INITIAL DOSE AS NEEDED. MAX 200MG  IN 24 HOURS     Off-Protocol Failed - 10/01/2022 12:54 PM      Failed - Medication not assigned to a protocol, review manually.      Passed - Valid encounter within last 12 months    Recent Outpatient Visits           5 months ago PTSD (post-traumatic stress disorder)   [place], DO   2 years ago Migraine with aura and without status migrainosus, not intractable   [place], DO   3 years ago Annual physical exam   [facility] [place], [place], DO   3 years ago Abnormal uterine bleeding (AUB)   [place], [place]

## 2023-02-12 ENCOUNTER — Encounter: Payer: Self-pay | Admitting: Psychiatry

## 2023-02-12 ENCOUNTER — Ambulatory Visit (INDEPENDENT_AMBULATORY_CARE_PROVIDER_SITE_OTHER): Payer: Medicaid Other | Admitting: Psychiatry

## 2023-02-12 VITALS — BP 122/86 | HR 69 | Temp 98.8°F | Ht 69.0 in | Wt 252.0 lb

## 2023-02-12 DIAGNOSIS — R0683 Snoring: Secondary | ICD-10-CM | POA: Insufficient documentation

## 2023-02-12 DIAGNOSIS — F3342 Major depressive disorder, recurrent, in full remission: Secondary | ICD-10-CM | POA: Diagnosis not present

## 2023-02-12 DIAGNOSIS — F4312 Post-traumatic stress disorder, chronic: Secondary | ICD-10-CM

## 2023-02-12 DIAGNOSIS — R5383 Other fatigue: Secondary | ICD-10-CM | POA: Diagnosis not present
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
## 2023-02-12 NOTE — Patient Instructions (Signed)
Exercising to Stay Healthy To become healthy and stay healthy, it is recommended that you do moderate-intensity and vigorous-intensity exercise. You can tell that you are exercising at a moderate intensity if your heart starts beating faster and you start breathing faster but can still hold a conversation. You can tell that you are exercising at a vigorous intensity if you are breathing much harder and faster and cannot hold a conversation while exercising. How can exercise benefit me? Exercising regularly is important. It has many health benefits, such as: Improving overall fitness, flexibility, and endurance. Increasing bone density. Helping with weight control. Decreasing body fat. Increasing muscle strength and endurance. Reducing stress and tension, anxiety, depression, or anger. Improving overall health. What guidelines should I follow while exercising? Before you start a new exercise program, talk with your health care provider. Do not exercise so much that you hurt yourself, feel dizzy, or get very short of breath. Wear comfortable clothes and wear shoes with good support. Drink plenty of water while you exercise to prevent dehydration or heat stroke. Work out until your breathing and your heartbeat get faster (moderate intensity). How often should I exercise? Choose an activity that you enjoy, and set realistic goals. Your health care provider can help you make an activity plan that is individually designed and works best for you. Exercise regularly as told by your health care provider. This may include: Doing strength training two times a week, such as: Lifting weights. Using resistance bands. Push-ups. Sit-ups. Yoga. Doing a certain intensity of exercise for a given amount of time. Choose from these options: A total of 150 minutes of moderate-intensity exercise every week. A total of 75 minutes of vigorous-intensity exercise every week. A mix of moderate-intensity and  vigorous-intensity exercise every week. Children, pregnant women, people who have not exercised regularly, people who are overweight, and older adults may need to talk with a health care provider about what activities are safe to perform. If you have a medical condition, be sure to talk with your health care provider before you start a new exercise program. What are some exercise ideas? Moderate-intensity exercise ideas include: Walking 1 mile (1.6 km) in about 15 minutes. Biking. Hiking. Golfing. Dancing. Water aerobics. Vigorous-intensity exercise ideas include: Walking 4.5 miles (7.2 km) or more in about 1 hour. Jogging or running 5 miles (8 km) in about 1 hour. Biking 10 miles (16.1 km) or more in about 1 hour. Lap swimming. Roller-skating or in-line skating. Cross-country skiing. Vigorous competitive sports, such as football, basketball, and soccer. Jumping rope. Aerobic dancing. What are some everyday activities that can help me get exercise? Yard work, such as: Pushing a lawn mower. Raking and bagging leaves. Washing your car. Pushing a stroller. Shoveling snow. Gardening. Washing windows or floors. How can I be more active in my day-to-day activities? Use stairs instead of an elevator. Take a walk during your lunch break. If you drive, park your car farther away from your work or school. If you take public transportation, get off one stop early and walk the rest of the way. Stand up or walk around during all of your indoor phone calls. Get up, stretch, and walk around every 30 minutes throughout the day. Enjoy exercise with a friend. Support to continue exercising will help you keep a regular routine of activity. Where to find more information You can find more information about exercising to stay healthy from: U.S. Department of Health and Human Services: www.hhs.gov Centers for Disease Control and Prevention (  CDC): www.cdc.gov Summary Exercising regularly is  important. It will improve your overall fitness, flexibility, and endurance. Regular exercise will also improve your overall health. It can help you control your weight, reduce stress, and improve your bone density. Do not exercise so much that you hurt yourself, feel dizzy, or get very short of breath. Before you start a new exercise program, talk with your health care provider. This information is not intended to replace advice given to you by your health care provider. Make sure you discuss any questions you have with your health care provider. Document Revised: 12/24/2020 Document Reviewed: 12/24/2020 Elsevier Patient Education  2024 [employer]vier Inc.  

## 2023-03-03 ENCOUNTER — Other Ambulatory Visit: Payer: Self-pay | Admitting: Family Medicine

## 2023-03-03 DIAGNOSIS — G43109 Migraine with aura, not intractable, without status migrainosus: Secondary | ICD-10-CM

## 2023-03-05 NOTE — Telephone Encounter (Signed)
Requested medications are due for refill today.  unsure  Requested medications are on the active medications list.  yes  Last refill. 04/25/2022 #16 5 rf  Future visit scheduled.   no  Notes to clinic.  Marisue Ivan listed as PCP. Refill not delegated.    Requested Prescriptions  Pending Prescriptions Disp Refills   UBRELVY 100 MG TABS [Pharmacy Med Name: UBRELVY 100MG  TABLETS] 16 tablet 5    Sig: TAKE 1 TABLET BY MOUTH AS A SINGLE DOSE. A 2ND DOSE MAY BE TAKEN AT LEAST 2 HOURS AFTER THE 1ST DOSE AS NEEDED. MAX 2 TABLETS IN 24 HOURS     Off-Protocol Failed - 03/03/2023  8:38 AM      Failed - Medication not assigned to a protocol, review manually.      Passed - Valid encounter within last 12 months    Recent Outpatient Visits           10 months ago PTSD (post-traumatic stress disorder)   [place] [facility] [place], Netta Neat, DO   2 years ago Migraine with aura and without status migrainosus, not intractable   [place] [facility] [place], Netta Neat, DO   3 years ago Annual physical exam   [place] [facility] [place], [place], DO   3 years ago Abnormal uterine bleeding (AUB)   [place] [facility] [place], Netta Neat, [place]

## 2023-03-26 ENCOUNTER — Institutional Professional Consult (permissible substitution): Payer: Medicaid Other | Admitting: Internal Medicine

## 2023-05-15 ENCOUNTER — Ambulatory Visit (INDEPENDENT_AMBULATORY_CARE_PROVIDER_SITE_OTHER): Payer: Medicaid Other | Admitting: Adult Health

## 2023-05-15 ENCOUNTER — Encounter: Payer: Self-pay | Admitting: Adult Health

## 2023-05-15 VITALS — BP 126/76 | HR 61 | Temp 98.4°F | Ht 69.0 in | Wt 263.0 lb

## 2023-05-15 DIAGNOSIS — R0683 Snoring: Secondary | ICD-10-CM

## 2023-05-15 DIAGNOSIS — G4719 Other hypersomnia: Secondary | ICD-10-CM

## 2023-05-15 NOTE — Assessment & Plan Note (Signed)
Snoring, daytime sleepiness, restless sleep, gasping for air during dozing off, daytime fatigue all concerning for underlying sleep apnea.  Patient education given on sleep apnea.  Will set patient up for home sleep study  - discussed how weight can impact sleep and risk for sleep disordered breathing - discussed options to assist with weight loss: combination of diet modification, cardiovascular and strength training exercises   - had an extensive discussion regarding the adverse health consequences related to untreated sleep disordered breathing - specifically discussed the risks for hypertension, coronary artery disease, cardiac dysrhythmias, cerebrovascular disease, and diabetes - lifestyle modification discussed   - discussed how sleep disruption can increase risk of accidents, particularly when driving - safe driving practices were discussed   Plan  Patient Instructions  Set up for home sleep study  Work on healthy sleep regimen  Follow up in 6 weeks to discuss results and treatment plan

## 2023-05-15 NOTE — Progress Notes (Signed)
@Patient  ID: [name], female    DOB: 1986-03-03, 37 y.o.   MRN: [id number]  Chief Complaint  Patient presents with   sleep consult    Referring provider: Jomarie Longs, MD  HPI: 37 year old female seen for sleep consult May 15, 2023 for snoring, restless sleep and daytime sleepiness  TEST/EVENTS :   05/15/2023 Sleep consult  Patient presents for a sleep consult today.  Kindly referred by psychology.  Patient has a history of depression, anxiety and chronic headaches.  Complains of snoring feeling exhausted, daytime sleepiness, restless sleep, gasping for air while dozing off to sleep.  There was concern that she may have underlying sleep apnea.  Typically goes to bed about 10 PM.  Takes up to an hour to go to sleep.  Is up multiple times throughout the night.  Has trouble going back to sleep at times at night.  Gets up at 7 AM.  Has gained over 50 pounds in the last year.  Has 1 cup of coffee daily.  Usually 1 energy drink weekly.  Does not nap.  Does have chronic headaches.  No history of congestive heart failure or stroke.  No symptoms suspicious for cataplexy or sleep paralysis.  Takes melatonin 5 mg each night.  Has no removable dental work.  Patient says no matter how much sleep she gets she always feels tired.  Epworth score is 0 out of 24.  Does have a family history of sleep apnea. No history of congestive heart failure or stroke.  No removable dental work.  Social history patient is divorced.  Lives at home with her child.  She works as a Catering manager.  He is self-employed.  Works Water engineer.  Is a never smoker.  Drinks social alcohol.  No drug use.   Family history positive for sleep apnea  Past Surgical History:  Procedure Laterality Date   CHOLECYSTECTOMY  2015   COLPOSCOPY     DIAGNOSTIC LAPAROSCOPY WITH REMOVAL OF ECTOPIC PREGNANCY Right 02/13/2017   Procedure: DIAGNOSTIC LAPAROSCOPY WITH REMOVAL OF ECTOPIC PREGNANCY, right salpingectomy;  Surgeon: Linzie Collin, MD;  Location: [facility];  Service: Gynecology;  Laterality: Right;   ELBOW SURGERY Right 11/2020   ORIF HUMERUS FRACTURE Right 12/02/2020   Procedure: OPEN REDUCTION INTERNAL FIXATION (ORIF) DISTAL HUMERUS FRACTURE;  Surgeon: Bjorn Pippin, MD;  Location: [facility];  Service: Orthopedics;  Laterality: Right;     Allergies  Allergen Reactions   Bupropion Other (See Comments)    Causes seizures. Seizure     Immunization History  Administered Date(s) Administered   Hepatitis A 09/16/2012   Influenza Inj Mdck Quad Pf 08/20/2018   Influenza,inj,Quad PF,6+ Mos 06/29/2016, 07/09/2017, 06/03/2019, 07/27/2020   Influenza-Unspecified 05/12/2022   PFIZER(Purple Top)SARS-COV-2 Vaccination 04/22/2020, 06/03/2020   Td 11/11/1999   Tdap 05/25/2016, 08/05/2016    Past Medical History:  Diagnosis Date   Alcoholic cirrhosis of liver (HCC)    12/2011   Allergy    Anxiety    COVID-19 01/2021   Depression    Ectopic pregnancy    Frequent headaches    GERD (gastroesophageal reflux disease)    Hypertension    no issue since 2011   Seizures (HCC)    x1 in 2012    Tobacco History: Social History   Tobacco Use  Smoking Status Never  Smokeless Tobacco Never   Counseling given: Not Answered   Outpatient Medications Prior to Visit  Medication Sig Dispense Refill   omeprazole (PRILOSEC) 40  MG capsule Take 40 mg by mouth daily.     UBRELVY 100 MG TABS Take 100 mg tablet PO as a single dose. A second dose may be taken at least 2 hours after the initial dose if needed. For max dose of 200mg  in 24 hours. 16 tablet 5   venlafaxine XR (EFFEXOR-XR) 37.5 MG 24 hr capsule Take by mouth.     No facility-administered medications prior to visit.     Review of Systems:   Constitutional:   No  weight loss, night sweats,  Fevers, chills,  +fatigue, or  lassitude.  HEENT:   No headaches,  Difficulty swallowing,  Tooth/dental problems, or  Sore throat,                No  sneezing, itching, ear ache, nasal congestion, post nasal drip,   CV:  No chest pain,  Orthopnea, PND, swelling in lower extremities, anasarca, dizziness, palpitations, syncope.   GI  No heartburn, indigestion, abdominal pain, nausea, vomiting, diarrhea, change in bowel habits, loss of appetite, bloody stools.   Resp: No shortness of breath with exertion or at rest.  No excess mucus, no productive cough,  No non-productive cough,  No coughing up of blood.  No change in color of mucus.  No wheezing.  No chest wall deformity  Skin: no rash or lesions.  GU: no dysuria, change in color of urine, no urgency or frequency.  No flank pain, no hematuria   MS:  No joint pain or swelling.  No decreased range of motion.  No back pain.    Physical Exam  BP 126/76 (BP Location: Right Arm, Cuff Size: Large)   Pulse 61   Temp 98.4 F (36.9 C) (Oral)   Ht 5\' 9"  (1.753 m)   Wt 263 lb (119.3 kg)   SpO2 100%   BMI 38.84 kg/m   GEN: A/Ox3; pleasant , NAD, well nourished    HEENT:  [place]/AT,  EACs-clear, TMs-wnl, NOSE-clear, THROAT-clear, no lesions, no postnasal drip or exudate noted.  Class III MP airway  NECK:  Supple w/ fair ROM; no JVD; normal carotid impulses w/o bruits; no thyromegaly or nodules palpated; no lymphadenopathy.    RESP  Clear  P & A; w/o, wheezes/ rales/ or rhonchi. no accessory muscle use, no dullness to percussion  CARD:  RRR, no m/r/g, no peripheral edema, pulses intact, no cyanosis or clubbing.  GI:   Soft & nt; nml bowel sounds; no organomegaly or masses detected.   Musco: Warm bil, no deformities or joint swelling noted.   Neuro: alert, no focal deficits noted.    Skin: Warm, no lesions or rashes    Lab Results:  CBC     BNP No results found for: "BNP"  ProBNP No results found for: "PROBNP"  Imaging: No results found.  Administration History     None           No data to display          No results found for:  "NITRICOXIDE"      Assessment & Plan:   Snoring Snoring, daytime sleepiness, restless sleep, gasping for air during dozing off, daytime fatigue all concerning for underlying sleep apnea.  Patient education given on sleep apnea.  Will set patient up for home sleep study  - discussed how weight can impact sleep and risk for sleep disordered breathing - discussed options to assist with weight loss: combination of diet modification, cardiovascular and strength training exercises   -  had an extensive discussion regarding the adverse health consequences related to untreated sleep disordered breathing - specifically discussed the risks for hypertension, coronary artery disease, cardiac dysrhythmias, cerebrovascular disease, and diabetes - lifestyle modification discussed   - discussed how sleep disruption can increase risk of accidents, particularly when driving - safe driving practices were discussed   Plan  Patient Instructions  Set up for home sleep study  Work on healthy sleep regimen  Follow up in 6 weeks to discuss results and treatment plan       Rubye Oaks, NP 05/15/2023

## 2023-05-15 NOTE — Patient Instructions (Signed)
Set up for home sleep study  Work on healthy sleep regimen  Follow up in 6 weeks to discuss results and treatment plan

## 2023-05-17 ENCOUNTER — Telehealth: Payer: Medicaid Other | Admitting: Psychiatry

## 2023-06-08 ENCOUNTER — Ambulatory Visit: Payer: Medicaid Other

## 2023-06-08 DIAGNOSIS — G4733 Obstructive sleep apnea (adult) (pediatric): Secondary | ICD-10-CM | POA: Diagnosis not present

## 2023-06-08 DIAGNOSIS — G4719 Other hypersomnia: Secondary | ICD-10-CM

## 2023-06-11 ENCOUNTER — Telehealth (INDEPENDENT_AMBULATORY_CARE_PROVIDER_SITE_OTHER): Payer: Medicaid Other | Admitting: Psychiatry

## 2023-06-11 ENCOUNTER — Encounter: Payer: Self-pay | Admitting: Psychiatry

## 2023-06-11 DIAGNOSIS — F4312 Post-traumatic stress disorder, chronic: Secondary | ICD-10-CM

## 2023-06-11 DIAGNOSIS — F3342 Major depressive disorder, recurrent, in full remission: Secondary | ICD-10-CM

## 2023-06-11 DIAGNOSIS — R0683 Snoring: Secondary | ICD-10-CM | POA: Diagnosis not present

## 2023-06-11 NOTE — Progress Notes (Signed)
Virtual Visit via Video Note  I connected with [name] on 06/11/23 at  3:30 PM EDT by a video enabled telemedicine application and verified that I am speaking with the correct person using two identifiers.  Location Provider Location : [facility] Patient Location : Home  Participants: Patient , Provider   I discussed the limitations of evaluation and management by telemedicine and the availability of in person appointments. The patient expressed understanding and agreed to proceed.   I discussed the assessment and treatment plan with the patient. The patient was provided an opportunity to ask questions and all were answered. The patient agreed with the plan and demonstrated an understanding of the instructions.   The patient was advised to call back or seek an in-person evaluation if the symptoms worsen or if the condition fails to improve as anticipated.   BH MD OP Progress Note  06/11/2023 3:57 PM Andora Krull  MRN:  [id number]  Chief Complaint:  Chief Complaint  Patient presents with   Follow-up   Anxiety   Medication Refill   HPI: [name] is a 37 year old Caucasian female, currently employed, divorced, has a history of MDD, chronic PTSD, migraine headaches, snoring was evaluated by telemedicine today.  Patient today reports overall mood wise she is doing fairly well.  Denies any significant depression, anxiety or PTSD symptoms.  Patient continues to struggle with fatigue.  She does have snoring at night.  Patient was referred to pulmonology last visit.  Reports she completed home sleep study currently awaiting report.  She is also interested in losing weight however has not been able to find a time that works for her to go to the gym with her busy schedule and being a single mother of a 50-year-old.  She is currently trying to work around it.  Patient denies any suicidality, homicidality or perceptual disturbances.  Patient is compliant on the venlafaxine,  denies side effects.  Patient denies any other concerns today.  Visit Diagnosis:    ICD-10-CM   1. MDD (major depressive disorder), recurrent, in full remission (HCC)  F33.42     2. Chronic post-traumatic stress disorder (PTSD)  F43.12     3. Snoring  R06.83       Past Psychiatric History: I have reviewed past psychiatric history from progress note on 02/12/2023.  Past trials of medications like Wellbutrin-caused seizures, Zoloft, Prozac, Xanax-did not like it, Lexapro.  Past Medical History:  Past Medical History:  Diagnosis Date   Alcoholic cirrhosis of liver (HCC)    12/2011   Allergy    Anxiety    COVID-19 01/2021   Depression    Ectopic pregnancy    Frequent headaches    GERD (gastroesophageal reflux disease)    Hypertension    no issue since 2011   Seizures (HCC)    x1 in 2012    Past Surgical History:  Procedure Laterality Date   CHOLECYSTECTOMY  2015   COLPOSCOPY     DIAGNOSTIC LAPAROSCOPY WITH REMOVAL OF ECTOPIC PREGNANCY Right 02/13/2017   Procedure: DIAGNOSTIC LAPAROSCOPY WITH REMOVAL OF ECTOPIC PREGNANCY, right salpingectomy;  Surgeon: Linzie Collin, MD;  Location: [facility];  Service: Gynecology;  Laterality: Right;   ELBOW SURGERY Right 11/2020   ORIF HUMERUS FRACTURE Right 12/02/2020   Procedure: OPEN REDUCTION INTERNAL FIXATION (ORIF) DISTAL HUMERUS FRACTURE;  Surgeon: Bjorn Pippin, MD;  Location: [facility];  Service: Orthopedics;  Laterality: Right;    Family Psychiatric History: Reviewed family psychiatric  history from progress note on 02/12/2023.  Family History:  Family History  Problem Relation Age of Onset   Alcohol abuse Mother    Alcohol abuse Father     Social History: Reviewed social history from progress note on 02/12/2023. Social History   Socioeconomic History   Marital status: Divorced    Spouse name: Not on file   Number of children: 1   Years of education: Not on file   Highest education level: Bachelor's  degree (e.g., BA, AB, BS)  Occupational History   Not on file  Tobacco Use   Smoking status: Never   Smokeless tobacco: Never  Vaping Use   Vaping status: Never Used  Substance and Sexual Activity   Alcohol use: Yes    Alcohol/week: 4.0 standard drinks of alcohol    Types: 4 Glasses of wine per week    Comment: occasionally twice a week   Drug use: No   Sexual activity: Yes  Other Topics Concern   Not on file  Social History Narrative   Not on file   Social Determinants of Health   Financial Resource Strain: Low Risk  (03/31/2023)   Received from [facility]   Overall Financial Resource Strain (CARDIA)    Difficulty of Paying Living Expenses: Not hard at all  Food Insecurity: No Food Insecurity (03/31/2023)   Received from [facility]   Hunger Vital Sign    Worried About Running Out of Food in the Last Year: Never true    Ran Out of Food in the Last Year: Never true  Transportation Needs: No Transportation Needs (03/31/2023)   Received from [facility] - Transportation    In the past 12 months, has lack of transportation kept you from medical appointments or from getting medications?: No    Lack of Transportation (Non-Medical): No  Physical Activity: Sufficiently Active (03/02/2022)   Received from [facility], [facility]   Exercise Vital Sign    Days of Exercise per Week: 4 days    Minutes of Exercise per Session: 60 min  Stress: Stress Concern Present (03/02/2022)   Received from [facility], [facility]   [employer] of Occupational Health - Occupational Stress Questionnaire    Feeling of Stress : Very much  Social Connections: Moderately Integrated (03/02/2022)   Received from [facility], [facility]   Social Connection and Isolation Panel [NHANES]    Frequency of Communication with Friends and  Family: Three times a week    Frequency of Social Gatherings with Friends and Family: Once a week    Attends Religious Services: More than 4 times per year    Active Member of [employer] or Organizations: Yes    Attends Banker Meetings: More than 4 times per year    Marital Status: Separated    Allergies:  Allergies  Allergen Reactions   Bupropion Other (See Comments)    Causes seizures. Seizure     Metabolic Disorder Labs: Lab Results  Component Value Date   HGBA1C 4.5 05/08/2019   MPG 82 05/08/2019   No results found for: "PROLACTIN" Lab Results  Component Value Date   CHOL 150 05/08/2019   TRIG 88 05/08/2019   HDL 70 05/08/2019   CHOLHDL 2.1 05/08/2019   LDLCALC 63 05/08/2019   Lab Results  Component Value Date   TSH 0.78 05/08/2019    Therapeutic  Level Labs: No results found for: "LITHIUM" No results found for: "VALPROATE" No results found for: "CBMZ"  Current Medications: Current Outpatient Medications  Medication Sig Dispense Refill   omeprazole (PRILOSEC) 40 MG capsule Take 40 mg by mouth daily.     UBRELVY 100 MG TABS Take 100 mg tablet PO as a single dose. A second dose may be taken at least 2 hours after the initial dose if needed. For max dose of 200mg  in 24 hours. 16 tablet 5   venlafaxine XR (EFFEXOR-XR) 37.5 MG 24 hr capsule Take by mouth.     No current facility-administered medications for this visit.     Musculoskeletal: Strength & Muscle Tone:  UTA Gait & Station:  Normal Patient leans: N/A  Psychiatric Specialty Exam: Review of Systems  Psychiatric/Behavioral:  Positive for sleep disturbance.     There were no vitals taken for this visit.There is no height or weight on file to calculate BMI.  General Appearance: Fairly Groomed  Eye Contact:  Fair  Speech:  Clear and Coherent  Volume:  Normal  Mood:  Euthymic  Affect:  Congruent  Thought Process:  Goal Directed and Descriptions of Associations: Intact  Orientation:  Full  (Time, Place, and Person)  Thought Content: Logical   Suicidal Thoughts:  No  Homicidal Thoughts:  No  Memory:  Immediate;   Fair Recent;   Fair Remote;   Fair  Judgement:  Fair  Insight:  Fair  Psychomotor Activity:  Normal  Concentration:  Concentration: Fair and Attention Span: Fair  Recall:  [employer] of Knowledge: Fair  Language: Fair  Akathisia:  No  Handed:  Right  AIMS (if indicated): not done  Assets:  Communication Skills Desire for Improvement Housing Social Support  ADL's:  Intact  Cognition: WNL  Sleep:   excessive   Screenings: GAD-7    Flowsheet Row Office Visit from 02/12/2023 in [facility] Office Visit from 04/19/2022 in [facility]  Total GAD-7 Score 3 15      PHQ2-9    Flowsheet Row Office Visit from 02/12/2023 in [facility] Office Visit from 04/19/2022 in [facility] Office Visit from 07/27/2020 in [facility] Office Visit from 04/01/2019 in [facility]  PHQ-2 Total Score 1 2 0 0  PHQ-9 Total Score -- 7 -- --      Flowsheet Row Video Visit from 06/11/2023 in [facility] Office Visit from 02/12/2023 in [facility] ED from 03/01/2021 in [facility]   C-SSRS RISK CATEGORY No Risk Low Risk No Risk        Assessment and Plan: Yides Saidi is a 37 year old Caucasian female currently divorced, employed, has a history of PTSD, MDD, migraine headaches, fatigue, snoring was evaluated by telemedicine today.  Patient with fatigue and snoring completed sleep study currently awaiting report otherwise doing fairly well on the venlafaxine with regards to mood symptoms.  Plan as noted below.  Plan MDD in remission Venlafaxine extended release 37.5 mg p.o. daily, currently  has prescription available provided by primary care provider. Continue CBT with therapist at [facility].  PTSD-stable Continue venlafaxine as prescribed Continue CBT.  Fatigue/snoring-unstable Pending sleep study report. Patient will also benefit from TSH, vitamin B12-patient wanted to discuss with primary care provider.   Collaboration of Care: Collaboration of  Care: Referral or follow-up with counselor/therapist AEB patient continue CBT.  Patient/Guardian was advised Release of Information must be obtained prior to any record release in order to collaborate their care with an outside provider. Patient/Guardian was advised if they have not already done so to contact the registration department to sign all necessary forms in order for [place] to release information regarding their care.   Consent: Patient/Guardian gives verbal consent for treatment and assignment of benefits for services provided during this visit. Patient/Guardian expressed understanding and agreed to proceed.   Follow-up in clinic in 3 months or sooner if needed.  This note was generated in part or whole with voice recognition software. Voice recognition is usually quite accurate but there are transcription errors that can and very often do occur. I apologize for any typographical errors that were not detected and corrected.    Jomarie Longs, MD 06/11/2023, 3:57 PM

## 2023-06-13 DIAGNOSIS — G4733 Obstructive sleep apnea (adult) (pediatric): Secondary | ICD-10-CM | POA: Diagnosis not present

## 2023-06-29 ENCOUNTER — Encounter: Payer: Self-pay | Admitting: Adult Health

## 2023-06-29 ENCOUNTER — Telehealth (INDEPENDENT_AMBULATORY_CARE_PROVIDER_SITE_OTHER): Payer: Medicaid Other | Admitting: Adult Health

## 2023-06-29 DIAGNOSIS — G4733 Obstructive sleep apnea (adult) (pediatric): Secondary | ICD-10-CM | POA: Diagnosis not present

## 2023-06-29 NOTE — Patient Instructions (Signed)
Begin CPAP At bedtime , goal is to wear 6hrs or more  Work on healthy sleep regimen  Work on healthy weight loss  Follow up in 3 months and As needed  -Can use Friday PM Virtual slot

## 2023-06-29 NOTE — Addendum Note (Signed)
Addended by: Bonney Leitz on: 06/29/2023 03:55 PM   Modules accepted: Orders

## 2023-06-29 NOTE — Progress Notes (Signed)
Virtual Visit via Video Note  I connected with [name] on 06/29/23 at  3:00 PM EDT by a video enabled telemedicine application and verified that I am speaking with the correct person using two identifiers.  Location: Patient: Home  Provider: Office    I discussed the limitations of evaluation and management by telemedicine and the availability of in person appointments. The patient expressed understanding and agreed to proceed.  History of Present Illness: 37 yo female seen for sleep consult May 15, 2023 for snoring, restless sleep and daytime sleepiness found to have mild obstructive sleep apnea  Today's video visit is discuss sleep study results.  Patient was seen last month for a sleep consult.  She had snoring, restless sleep and daytime sleepiness.  She was set up for home sleep study that was done on June 10, 2023.  This showed mild sleep apnea with AHI of 7.8/hour and SpO2 low at 82%.  We discussed her sleep study results in detail.  Went over treatment options.  Patient would like to proceed with CPAP therapy  Past Medical History:  Diagnosis Date   Alcoholic cirrhosis of liver (HCC)    12/2011   Allergy    Anxiety    COVID-19 01/2021   Depression    Ectopic pregnancy    Frequent headaches    GERD (gastroesophageal reflux disease)    Hypertension    no issue since 2011   Seizures (HCC)    x1 in 2012    Current Outpatient Medications on File Prior to Visit  Medication Sig Dispense Refill   omeprazole (PRILOSEC) 40 MG capsule Take 40 mg by mouth daily.     UBRELVY 100 MG TABS Take 100 mg tablet PO as a single dose. A second dose may be taken at least 2 hours after the initial dose if needed. For max dose of 200mg  in 24 hours. 16 tablet 5   venlafaxine XR (EFFEXOR-XR) 37.5 MG 24 hr capsule Take by mouth.     No current facility-administered medications on file prior to visit.       Observations/Objective: Appears in no acute distress  Assessment and  Plan: Mild obstructive sleep apnea.  Begin CPAP therapy.  Will begin auto CPAP 5 to 15 cm H2O.  - discussed how weight can impact sleep and risk for sleep disordered breathing - discussed options to assist with weight loss: combination of diet modification, cardiovascular and strength training exercises   - had an extensive discussion regarding the adverse health consequences related to untreated sleep disordered breathing - specifically discussed the risks for hypertension, coronary artery disease, cardiac dysrhythmias, cerebrovascular disease, and diabetes - lifestyle modification discussed   - discussed how sleep disruption can increase risk of accidents, particularly when driving - safe driving practices were discussed   Plan  Patient Instructions  Begin CPAP At bedtime , goal is to wear 6hrs or more  Work on healthy sleep regimen  Work on healthy weight loss  Follow up in 3 months and As needed  -Can use Friday PM Virtual slot     Follow Up Instructions:    I discussed the assessment and treatment plan with the patient. The patient was provided an opportunity to ask questions and all were answered. The patient agreed with the plan and demonstrated an understanding of the instructions.   The patient was advised to call back or seek an in-person evaluation if the symptoms worsen or if the condition fails to improve as anticipated.  I provided 20  minutes of non-face-to-face time during this encounter.   Rubye Oaks, NP

## 2023-09-09 ENCOUNTER — Encounter: Payer: Self-pay | Admitting: Emergency Medicine

## 2023-09-09 ENCOUNTER — Inpatient Hospital Stay
Admission: EM | Admit: 2023-09-09 | Discharge: 2023-09-11 | DRG: 493 | Disposition: A | Discharge status: Home / Self Care | Payer: Medicaid Other | Attending: Internal Medicine | Admitting: Internal Medicine

## 2023-09-09 ENCOUNTER — Other Ambulatory Visit: Payer: Self-pay

## 2023-09-09 ENCOUNTER — Emergency Department: Payer: Medicaid Other

## 2023-09-09 DIAGNOSIS — S82452A Displaced comminuted fracture of shaft of left fibula, initial encounter for closed fracture: Secondary | ICD-10-CM | POA: Diagnosis present

## 2023-09-09 DIAGNOSIS — S82302D Unspecified fracture of lower end of left tibia, subsequent encounter for closed fracture with routine healing: Secondary | ICD-10-CM | POA: Diagnosis present

## 2023-09-09 DIAGNOSIS — G4733 Obstructive sleep apnea (adult) (pediatric): Secondary | ICD-10-CM | POA: Diagnosis present

## 2023-09-09 DIAGNOSIS — S82252A Displaced comminuted fracture of shaft of left tibia, initial encounter for closed fracture: Principal | ICD-10-CM | POA: Diagnosis present

## 2023-09-09 DIAGNOSIS — Z811 Family history of alcohol abuse and dependence: Secondary | ICD-10-CM

## 2023-09-09 DIAGNOSIS — K219 Gastro-esophageal reflux disease without esophagitis: Secondary | ICD-10-CM | POA: Diagnosis present

## 2023-09-09 DIAGNOSIS — F32A Depression, unspecified: Secondary | ICD-10-CM | POA: Diagnosis present

## 2023-09-09 DIAGNOSIS — Y92009 Unspecified place in unspecified non-institutional (private) residence as the place of occurrence of the external cause: Secondary | ICD-10-CM

## 2023-09-09 DIAGNOSIS — I10 Essential (primary) hypertension: Secondary | ICD-10-CM | POA: Diagnosis present

## 2023-09-09 DIAGNOSIS — Z9049 Acquired absence of other specified parts of digestive tract: Secondary | ICD-10-CM

## 2023-09-09 DIAGNOSIS — Z6839 Body mass index (BMI) 39.0-39.9, adult: Secondary | ICD-10-CM | POA: Diagnosis not present

## 2023-09-09 DIAGNOSIS — F419 Anxiety disorder, unspecified: Secondary | ICD-10-CM | POA: Diagnosis present

## 2023-09-09 DIAGNOSIS — E871 Hypo-osmolality and hyponatremia: Secondary | ICD-10-CM | POA: Diagnosis present

## 2023-09-09 DIAGNOSIS — F1010 Alcohol abuse, uncomplicated: Secondary | ICD-10-CM | POA: Diagnosis present

## 2023-09-09 DIAGNOSIS — S82892A Other fracture of left lower leg, initial encounter for closed fracture: Secondary | ICD-10-CM | POA: Diagnosis not present

## 2023-09-09 DIAGNOSIS — K7030 Alcoholic cirrhosis of liver without ascites: Secondary | ICD-10-CM | POA: Diagnosis present

## 2023-09-09 DIAGNOSIS — W109XXA Fall (on) (from) unspecified stairs and steps, initial encounter: Secondary | ICD-10-CM | POA: Diagnosis present

## 2023-09-09 DIAGNOSIS — E66813 Obesity, class 3: Secondary | ICD-10-CM | POA: Diagnosis present

## 2023-09-09 DIAGNOSIS — Z888 Allergy status to other drugs, medicaments and biological substances status: Secondary | ICD-10-CM | POA: Diagnosis not present

## 2023-09-09 DIAGNOSIS — Z7982 Long term (current) use of aspirin: Secondary | ICD-10-CM

## 2023-09-09 DIAGNOSIS — Z8616 Personal history of COVID-19: Secondary | ICD-10-CM

## 2023-09-09 DIAGNOSIS — F4310 Post-traumatic stress disorder, unspecified: Secondary | ICD-10-CM | POA: Diagnosis present

## 2023-09-09 DIAGNOSIS — W19XXXA Unspecified fall, initial encounter: Secondary | ICD-10-CM | POA: Diagnosis not present

## 2023-09-09 DIAGNOSIS — S82875A Nondisplaced pilon fracture of left tibia, initial encounter for closed fracture: Secondary | ICD-10-CM | POA: Diagnosis not present

## 2023-09-09 DIAGNOSIS — S82302A Unspecified fracture of lower end of left tibia, initial encounter for closed fracture: Secondary | ICD-10-CM | POA: Diagnosis present

## 2023-09-09 DIAGNOSIS — S82402A Unspecified fracture of shaft of left fibula, initial encounter for closed fracture: Secondary | ICD-10-CM | POA: Diagnosis not present

## 2023-09-09 DIAGNOSIS — S82202A Unspecified fracture of shaft of left tibia, initial encounter for closed fracture: Principal | ICD-10-CM

## 2023-09-09 DIAGNOSIS — W108XXA Fall (on) (from) other stairs and steps, initial encounter: Secondary | ICD-10-CM | POA: Diagnosis not present

## 2023-09-09 DIAGNOSIS — E669 Obesity, unspecified: Secondary | ICD-10-CM | POA: Diagnosis present

## 2023-09-09 LAB — BASIC METABOLIC PANEL
Anion gap: 13 (ref 5–15)
BUN: 17 mg/dL (ref 6–20)
CO2: 20 mmol/L — ABNORMAL LOW (ref 22–32)
Calcium: 9 mg/dL (ref 8.9–10.3)
Chloride: 101 mmol/L (ref 98–111)
Creatinine, Ser: 0.64 mg/dL (ref 0.44–1.00)
GFR, Estimated: >60 mL/min (ref >60)
Glucose, Bld: 119 mg/dL — ABNORMAL HIGH (ref 70–99)
Potassium: 3.5 mmol/L (ref 3.5–5.1)
Sodium: 134 mmol/L — ABNORMAL LOW (ref 135–145)

## 2023-09-09 LAB — CBC WITH DIFFERENTIAL/PLATELET
Abs Immature Granulocytes: 0.02 10*3/uL (ref 0.00–0.07)
Basophils Absolute: 0 10*3/uL (ref 0.0–0.1)
Basophils Relative: 0 %
Eosinophils Absolute: 0.1 10*3/uL (ref 0.0–0.5)
Eosinophils Relative: 1 %
HCT: 38.2 % (ref 36.0–46.0)
Hemoglobin: 13.1 g/dL (ref 12.0–15.0)
Immature Granulocytes: 0 %
Lymphocytes Relative: 31 %
Lymphs Abs: 2 10*3/uL (ref 0.7–4.0)
MCH: 32.1 pg (ref 26.0–34.0)
MCHC: 34.3 g/dL (ref 30.0–36.0)
MCV: 93.6 fL (ref 80.0–100.0)
Monocytes Absolute: 0.4 10*3/uL (ref 0.1–1.0)
Monocytes Relative: 7 %
Neutro Abs: 3.7 10*3/uL (ref 1.7–7.7)
Neutrophils Relative %: 61 %
Platelets: 238 10*3/uL (ref 150–400)
RBC: 4.08 MIL/uL (ref 3.87–5.11)
RDW: 11.9 % (ref 11.5–15.5)
WBC: 6.3 10*3/uL (ref 4.0–10.5)
nRBC: 0 % (ref 0.0–0.2)

## 2023-09-09 MED ORDER — ONDANSETRON HCL 4 MG/2ML IJ SOLN
4.0000 mg | Freq: Once | INTRAMUSCULAR | Status: AC
  Administered 2023-09-09: 4 mg via INTRAVENOUS
  Filled 2023-09-09: qty 2

## 2023-09-09 MED ORDER — ONDANSETRON HCL 4 MG/2ML IJ SOLN: 4.0000 mg | Freq: Three times a day (TID) | INTRAMUSCULAR | Status: DC | PRN

## 2023-09-09 MED ORDER — ACETAMINOPHEN 325 MG PO TABS: 650.0000 mg | ORAL_TABLET | Freq: Four times a day (QID) | ORAL | Status: DC | PRN

## 2023-09-09 MED ORDER — HYDROMORPHONE HCL 1 MG/ML IJ SOLN
1.0000 mg | INTRAMUSCULAR | Status: DC | PRN
  Administered 2023-09-10 (×2): 1 mg via INTRAVENOUS
  Administered 2023-09-11: 0.5 mg via INTRAVENOUS
  Filled 2023-09-09 (×3): qty 1

## 2023-09-09 MED ORDER — HYDRALAZINE HCL 20 MG/ML IJ SOLN: 5.0000 mg | INTRAMUSCULAR | Status: DC | PRN

## 2023-09-09 MED ORDER — OXYCODONE-ACETAMINOPHEN 5-325 MG PO TABS
1.0000 | ORAL_TABLET | ORAL | Status: DC | PRN
  Administered 2023-09-10 – 2023-09-11 (×6): 1 via ORAL
  Filled 2023-09-09 (×3): qty 1

## 2023-09-09 MED ORDER — METHOCARBAMOL 500 MG PO TABS
500.0000 mg | ORAL_TABLET | Freq: Three times a day (TID) | ORAL | Status: DC | PRN
  Administered 2023-09-10 (×2): 500 mg via ORAL
  Filled 2023-09-09: qty 1

## 2023-09-09 MED ORDER — HYDROMORPHONE HCL 1 MG/ML IJ SOLN
1.0000 mg | Freq: Once | INTRAMUSCULAR | Status: AC
  Administered 2023-09-09: 1 mg via INTRAVENOUS
  Filled 2023-09-09: qty 1

## 2023-09-09 MED ORDER — HYDROMORPHONE HCL 1 MG/ML IJ SOLN
1.0000 mg | Freq: Once | INTRAMUSCULAR | Status: AC
Start: 2023-09-09 — End: 2023-09-09
  Administered 2023-09-09: 1 mg via INTRAVENOUS
  Filled 2023-09-09: qty 1

## 2023-09-09 MED ORDER — LIDOCAINE 5 % EX PTCH
1.0000 | MEDICATED_PATCH | CUTANEOUS | Status: DC
  Administered 2023-09-10: 1 via TRANSDERMAL
  Filled 2023-09-09 (×2): qty 1

## 2023-09-09 MED ORDER — SENNOSIDES-DOCUSATE SODIUM 8.6-50 MG PO TABS: 1.0000 | ORAL_TABLET | Freq: Every evening | ORAL | Status: DC | PRN

## 2023-09-09 NOTE — ED Provider Notes (Signed)
[facility] Provider Note    Event Date/Time   First MD Initiated Contact with Patient 09/09/23 2224     (approximate)   History   Fall and Leg Pain   HPI  Linde Leising is a 37 y.o. female with a history of hypertension, GERD, anxiety, and depression who presents with left ankle injury, acute onset when she fell down some steps.  The patient states that she was going down the steps when her dogs ran underneath her causing her to fall.  She states that the pain is in her left lower leg and ankle and that the leg was immediately deformed.  She states her foot was initially numb although it is not numb anymore.  She denies seeing her head or passing out.  She denies any other injuries.  I reviewed to the past medical records.  The patient's most recent outpatient encounter was with pulmonology on 10/18 for review of sleep study results.  She has no recent hospitalizations.   Physical Exam   Triage Vital Signs: ED Triage Vitals  Encounter Vitals Group     BP 09/09/23 2224 (!) 149/113     Systolic BP Percentile --      Diastolic BP Percentile --      Pulse Rate 09/09/23 2224 92     Resp 09/09/23 2224 (!) 23     Temp 09/09/23 2224 98.9 F (37.2 C)     Temp Source 09/09/23 2224 Oral     SpO2 09/09/23 2224 100 %     Weight --      Height --      Head Circumference --      Peak Flow --      Pain Score 09/09/23 2225 10     Pain Loc --      Pain Education --      Exclude from Growth Chart --     Most recent vital signs: Vitals:   09/09/23 2224  BP: (!) 149/113  Pulse: 92  Resp: (!) 23  Temp: 98.9 F (37.2 C)  SpO2: 100%    General: Awake, uncomfortable appearing, no distress.  CV:  Good peripheral perfusion.  Resp:  Normal effort.  Abd:  No distention.  Other:  Left distal leg deformity with the foot externally rotated.  2+ DP pulse.  Normal cap refill.  Normal sensation distally.   ED Results / Procedures / Treatments   Labs (all  labs ordered are listed, but only abnormal results are displayed) Labs Reviewed  BASIC METABOLIC PANEL - Abnormal; Notable for the following components:      Result Value   Sodium 134 (*)    CO2 20 (*)    Glucose, Bld 119 (*)    All other components within normal limits  CBC WITH DIFFERENTIAL/PLATELET  AMMONIA  PROTIME-INR  APTT  HIV ANTIBODY (ROUTINE TESTING W REFLEX)  CBC  BASIC METABOLIC PANEL  TYPE AND SCREEN     EKG     RADIOLOGY  XR L tibia/fibula: I independently viewed and interpreted the images; there is a comminuted displaced distal tibia/fibula fracture   PROCEDURES:  Critical Care performed: No  Procedures   MEDICATIONS ORDERED IN ED: Medications  HYDROmorphone (DILAUDID) injection 1 mg (has no administration in time range)  oxyCODONE-acetaminophen (PERCOCET/ROXICET) 5-325 MG per tablet 1 tablet (has no administration in time range)  methocarbamol (ROBAXIN) tablet 500 mg (has no administration in time range)  acetaminophen (TYLENOL) tablet 650 mg (has no  administration in time range)  lidocaine (LIDODERM) 5 % 1 patch (has no administration in time range)  ondansetron (ZOFRAN) injection 4 mg (has no administration in time range)  hydrALAZINE (APRESOLINE) injection 5 mg (has no administration in time range)  senna-docusate (Senokot-S) tablet 1 tablet (has no administration in time range)  ondansetron (ZOFRAN) injection 4 mg (4 mg Intravenous Given 09/09/23 2238)  HYDROmorphone (DILAUDID) injection 1 mg (1 mg Intravenous Given 09/09/23 2239)  HYDROmorphone (DILAUDID) injection 1 mg (1 mg Intravenous Given 09/09/23 2331)     IMPRESSION / MDM / ASSESSMENT AND PLAN / ED COURSE  I reviewed the triage vital signs and the nursing notes.  37 year old female with PMH as noted above presents with a left lower leg injury after a mechanical fall down some stairs.  There is a deformity on exam but the distal extremity is neuro/vascular intact.  Differential  diagnosis includes, but is not limited to, distal tibia/fibula or ankle fracture.  Patient's presentation is most consistent with acute presentation with potential threat to life or bodily function.  X-rays reveal a distal tibia/fibula fracture.  I consulted and discussed the case with Dr. Clemencia Course from orthopedics who reviewed the images.  He recommends placing a splint and admitting the patient to the hospitalist with a plan for surgery tomorrow morning.  ----------------------------------------- 11:46 PM on 09/09/2023 -----------------------------------------  BMP and CBC are unremarkable.  The patient's pain is improved with Dilaudid.  I consulted Dr. Clyde Lundborg from the hospitalist service; based on our discussion he agrees to evaluate the patient for admission.   FINAL CLINICAL IMPRESSION(S) / ED DIAGNOSES   Final diagnoses:  Closed fracture of left tibia and fibula, initial encounter     Rx / DC Orders   ED Discharge Orders     None        Note:  This document was prepared using Dragon voice recognition software and may include unintentional dictation errors.    Dionne Bucy, MD 09/09/23 [phone]

## 2023-09-09 NOTE — H&P (Signed)
History and Physical    [name] UJW:[id number] DOB: 02-08-1986 DOA: 09/09/2023  Referring MD/NP/PA:   PCP: Marisue Ivan, MD   Patient coming from:  The patient is coming from home.     Chief Complaint: fall and left leg pain  HPI: [name] is a 37 y.o. female with medical history significant of hypertension, GERD, depression with anxiety, obesity, alcoholic cirrhosis, migraine headaches, PTSD, seizure 2012, who presents with fall and left leg pain.  The patient states that she was going down the steps when her dogs ran underneath her causing her to fall, and injured her left lower leg, causing pain in left lower leg.  The pain is constant, sharp, severe, nonradiating, aggravated by movement.  Associated with left leg numbness.  No injury to her back.  No back pain.  She is not 100% sure if she injured her head, no loss of consciousness. Patient does not have nausea, vomiting, diarrhea or abdominal pain no symptoms of UTI.  No chest pain, cough, SOB.  No fever or chills.  Data reviewed independently and ED Course: pt was found to have WBC 6.3, temperature normal, blood pressure 149/113, heart rate 92, RR 23, oxygen saturation 100% on room air.  Patient is admitted to MedSurg bed as inpatient. X-ray showed comminuted and displaced distal tibial and fibular metaphyseal fractures. Dr. Clemencia Course of Ortho is consulted.   EKG: I have personally reviewed.  Sinus rhythm, QTc 476, low voltage, nonspecific T wave change.  Review of Systems:   General: no fevers, chills, no body weight gain, fatigue HEENT: no blurry vision, hearing changes or sore throat Respiratory: no dyspnea, coughing, wheezing CV: no chest pain, no palpitations GI: no nausea, vomiting, abdominal pain, diarrhea, constipation GU: no dysuria, burning on urination, increased urinary frequency, hematuria  Ext: no leg edema Neuro: no unilateral weakness, no vision change or hearing loss. Has fall Skin: no  rash, no skin tear. MSK: has left lower leg pain Heme: No easy bruising.  Travel history: No recent long distant travel.   Allergy:  Allergies  Allergen Reactions   Bupropion Other (See Comments)    Causes seizures. Seizure     Past Medical History:  Diagnosis Date   Alcoholic cirrhosis of liver (HCC)    12/2011   Allergy    Anxiety    COVID-19 01/2021   Depression    Ectopic pregnancy    Frequent headaches    GERD (gastroesophageal reflux disease)    Hypertension    no issue since 2011   Seizures (HCC)    x1 in 2012    Past Surgical History:  Procedure Laterality Date   CHOLECYSTECTOMY  2015   COLPOSCOPY     DIAGNOSTIC LAPAROSCOPY WITH REMOVAL OF ECTOPIC PREGNANCY Right 02/13/2017   Procedure: DIAGNOSTIC LAPAROSCOPY WITH REMOVAL OF ECTOPIC PREGNANCY, right salpingectomy;  Surgeon: Linzie Collin, MD;  Location: [facility];  Service: Gynecology;  Laterality: Right;   ELBOW SURGERY Right 11/2020   ORIF HUMERUS FRACTURE Right 12/02/2020   Procedure: OPEN REDUCTION INTERNAL FIXATION (ORIF) DISTAL HUMERUS FRACTURE;  Surgeon: Bjorn Pippin, MD;  Location: [facility];  Service: Orthopedics;  Laterality: Right;    Social History:  reports that she has never smoked. She has never used smokeless tobacco. She reports current alcohol use of about 4.0 standard drinks of alcohol per week. She reports that she does not use drugs.  Family History:  Family History  Problem Relation Age of Onset  Alcohol abuse Mother    Alcohol abuse Father      Prior to Admission medications   Medication Sig Start Date End Date Taking? Authorizing Provider  omeprazole (PRILOSEC) 40 MG capsule Take 40 mg by mouth daily.    [provider]  UBRELVY 100 MG TABS Take 100 mg tablet PO as a single dose. A second dose may be taken at least 2 hours after the initial dose if needed. For max dose of 200mg  in 24 hours. 04/25/22   Althea Charon, Netta Neat, DO  venlafaxine XR  (EFFEXOR-XR) 37.5 MG 24 hr capsule Take by mouth. 01/03/23 01/03/24  [provider]    Physical Exam: Vitals:   09/09/23 2224 09/09/23 2330  BP: (!) 149/113 (!) 153/95  Pulse: 92 85  Resp: (!) 23   Temp: 98.9 F (37.2 C)   TempSrc: Oral   SpO2: 100% 100%   General: Not in acute distress HEENT:       Eyes: PERRL, EOMI, no jaundice       ENT: No discharge from the ears and nose, no pharynx injection, no tonsillar enlargement.        Neck: No JVD, no bruit, no mass felt. Heme: No neck lymph node enlargement. Cardiac: S1/S2, RRR, No murmurs, No gallops or rubs. Respiratory: No rales, wheezing, rhonchi or rubs. GI: Soft, nondistended, nontender, no rebound pain, no organomegaly, BS present. GU: No hematuria Ext: No pitting leg edema bilaterally. 1+DP/PT pulse bilaterally. Musculoskeletal: has tenderness in left lower leg Skin: No rashes.  Neuro: Alert, oriented X3, cranial nerves II-XII grossly intact, moves all extremities normally. Psych: Patient is not psychotic, no suicidal or hemocidal ideation.  Labs on Admission: I have personally reviewed following labs and imaging studies  CBC: Recent Labs  Lab 09/09/23 2248  WBC 6.3  NEUTROABS 3.7  HGB 13.1  HCT 38.2  MCV 93.6  PLT 238   Basic Metabolic Panel: Recent Labs  Lab 09/09/23 2248  NA 134*  K 3.5  CL 101  CO2 20*  GLUCOSE 119*  BUN 17  CREATININE 0.64  CALCIUM 9.0   GFR: CrCl cannot be calculated (Unknown ideal weight.). Liver Function Tests: No results for input(s): "AST", "ALT", "ALKPHOS", "BILITOT", "PROT", "ALBUMIN" in the last 168 hours. No results for input(s): "LIPASE", "AMYLASE" in the last 168 hours. No results for input(s): "AMMONIA" in the last 168 hours. Coagulation Profile: No results for input(s): "INR", "PROTIME" in the last 168 hours. Cardiac Enzymes: No results for input(s): "CKTOTAL", "CKMB", "CKMBINDEX", "TROPONINI" in the last 168 hours. BNP (last 3 results) No results for  input(s): "PROBNP" in the last 8760 hours. HbA1C: No results for input(s): "HGBA1C" in the last 72 hours. CBG: No results for input(s): "GLUCAP" in the last 168 hours. Lipid Profile: No results for input(s): "CHOL", "HDL", "LDLCALC", "TRIG", "CHOLHDL", "LDLDIRECT" in the last 72 hours. Thyroid Function Tests: No results for input(s): "TSH", "T4TOTAL", "FREET4", "T3FREE", "THYROIDAB" in the last 72 hours. Anemia Panel: No results for input(s): "VITAMINB12", "FOLATE", "FERRITIN", "TIBC", "IRON", "RETICCTPCT" in the last 72 hours. Urine analysis:    Component Value Date/Time   COLORURINE YELLOW (A) 10/26/2017 1814   APPEARANCEUR HAZY (A) 10/26/2017 1814   APPEARANCEUR Cloudy 12/14/2011 0255   LABSPEC 1.018 10/26/2017 1814   LABSPEC 1.024 12/14/2011 0255   PHURINE 6.0 10/26/2017 1814   GLUCOSEU NEGATIVE 10/26/2017 1814   GLUCOSEU Negative 12/14/2011 0255   HGBUR NEGATIVE 10/26/2017 1814   BILIRUBINUR NEGATIVE 10/26/2017 1814   BILIRUBINUR 2+ 12/14/2011  0255   KETONESUR NEGATIVE 10/26/2017 1814   PROTEINUR NEGATIVE 10/26/2017 1814   NITRITE NEGATIVE 10/26/2017 1814   LEUKOCYTESUR NEGATIVE 10/26/2017 1814   LEUKOCYTESUR Negative 12/14/2011 0255   Sepsis Labs: @LABRCNTIP (procalcitonin:4,lacticidven:4) )No results found for this or any previous visit (from the past 240 hours).   Radiological Exams on Admission: DG Tibia/Fibula Left Result Date: 09/09/2023 CLINICAL DATA:  Lower leg deformity after fall.  Fall down stairs. EXAM: LEFT TIBIA AND FIBULA - 2 VIEW COMPARISON:  None Available. FINDINGS: Comminuted and displaced distal tibial metaphyseal fracture. No convincing extension to the ankle joint. Comminuted and displaced distal fibular fracture. No definite mortise widening. No ankle joint effusion. Soft tissue edema is seen at the fracture site. No fracture of the proximal tibia or fibula. Knee alignment is maintained. IMPRESSION: Comminuted and displaced distal tibial and fibular  metaphyseal fractures. Electronically Signed   By: Narda Rutherford M.D.   On: 09/09/2023 23:02      Assessment/Plan Principal Problem:   Closed fracture of left distal tibia and fibula Active Problems:   Fall at home, initial encounter   HTN (hypertension)   Alcohol abuse   Alcoholic cirrhosis of liver (HCC)   Anxiety and depression   Obesity (BMI 30-39.9)   Assessment and Plan:   Closed fracture of left distal tibia and fibula: X-ray showed comminuted and displaced distal tibial and fibular metaphyseal fractures. Consulted Dr. Clemencia Course of ortho.   - will admit to Med-surg bed - Pain control: prn dilaurdid, percocet and tyleno - When necessary Zofran for nausea - Robaxin for muscle spasm - Lidoderm patch for pain - type and cross - INR/PTT  Fall at home, initial encounter - PT/OT when able to (not ordered now) -f/u CT-head  Hx of HTN (hypertension): not taking meds now. Bp 149/113 -IV hydralazine as needed  Alcohol abuse -CIWA protocol -Did counseling about importance of alcohol use  Alcoholic cirrhosis of liver (HCC): Mental status normal. -Check ammonia level, INR/PTT  Anxiety and depression -Effexor  Obesity (BMI 30-39.9): Body weight 119.3 kg, BMI -Encouraged losing weight  Presurgical evaluation: Patient is young, no history of CHF or CAD, currently no any signs of chest pain, SOB or CHF.  No further workup needed.  Patient is cleared for surgery.      DVT ppx: SCD  Code Status: Full code   Family Communication: not done, no family member is at bed side.    Disposition Plan:  Anticipate discharge back to previous environment  Consults called:  Dr. Clemencia Course of Ortho is consulted.  Admission status and Level of care: Med-Surg:    as inpt      Dispo: The patient is from: Home              Anticipated d/c is to:  may need rehab              Anticipated d/c date is: 2 days              Patient currently is not medically stable to  d/c.    Severity of Illness:  The appropriate patient status for this patient is INPATIENT. Inpatient status is judged to be reasonable and necessary in order to provide the required intensity of service to ensure the patient's safety. The patient's presenting symptoms, physical exam findings, and initial radiographic and laboratory data in the context of their chronic comorbidities is felt to place them at high risk for further clinical deterioration. Furthermore, it is not anticipated that the  patient will be medically stable for discharge from the hospital within 2 midnights of admission.   * I certify that at the point of admission it is my clinical judgment that the patient will require inpatient hospital care spanning beyond 2 midnights from the point of admission due to high intensity of service, high risk for further deterioration and high frequency of surveillance required.*       Date of Service 09/10/2023    Lorretta Harp [facility]   If 7PM-7AM, please contact night-coverage [email or web address] 09/10/2023, 1:31 AM

## 2023-09-09 NOTE — ED Triage Notes (Addendum)
Patient presents to ED via [facility] from home for fall. Reports she was walking down stairs, her dogs got underfoot and she slid down stairs and her L leg got caught underneath her.   Obvious deformity to L leg. Splinted by EMS  Tingling and numbness per patient. +DP

## 2023-09-10 ENCOUNTER — Inpatient Hospital Stay: Payer: Medicaid Other

## 2023-09-10 ENCOUNTER — Inpatient Hospital Stay: Payer: Medicaid Other | Admitting: Registered Nurse

## 2023-09-10 ENCOUNTER — Encounter
Admission: EM | Disposition: A | Discharge status: Home / Self Care | Payer: Self-pay | Source: Home / Self Care | Attending: Internal Medicine

## 2023-09-10 ENCOUNTER — Encounter: Payer: Self-pay | Admitting: Internal Medicine

## 2023-09-10 ENCOUNTER — Other Ambulatory Visit: Payer: Self-pay

## 2023-09-10 DIAGNOSIS — S82302A Unspecified fracture of lower end of left tibia, initial encounter for closed fracture: Secondary | ICD-10-CM

## 2023-09-10 DIAGNOSIS — S82875A Nondisplaced pilon fracture of left tibia, initial encounter for closed fracture: Secondary | ICD-10-CM | POA: Diagnosis not present

## 2023-09-10 DIAGNOSIS — S82402A Unspecified fracture of shaft of left fibula, initial encounter for closed fracture: Secondary | ICD-10-CM

## 2023-09-10 DIAGNOSIS — W19XXXA Unspecified fall, initial encounter: Secondary | ICD-10-CM | POA: Diagnosis not present

## 2023-09-10 DIAGNOSIS — F1010 Alcohol abuse, uncomplicated: Secondary | ICD-10-CM | POA: Diagnosis not present

## 2023-09-10 DIAGNOSIS — W108XXA Fall (on) (from) other stairs and steps, initial encounter: Secondary | ICD-10-CM

## 2023-09-10 DIAGNOSIS — S82892A Other fracture of left lower leg, initial encounter for closed fracture: Secondary | ICD-10-CM

## 2023-09-10 DIAGNOSIS — Y92009 Unspecified place in unspecified non-institutional (private) residence as the place of occurrence of the external cause: Secondary | ICD-10-CM | POA: Diagnosis not present

## 2023-09-10 HISTORY — PX: TIBIA IM NAIL INSERTION: SHX2516

## 2023-09-10 LAB — POC URINE PREG, ED: Preg Test, Ur: NEGATIVE

## 2023-09-10 LAB — TYPE AND SCREEN
ABO/RH(D): A POS
Antibody Screen: NEGATIVE

## 2023-09-10 LAB — BASIC METABOLIC PANEL
Anion gap: 10 (ref 5–15)
BUN: 18 mg/dL (ref 6–20)
CO2: 22 mmol/L (ref 22–32)
Calcium: 8.7 mg/dL — ABNORMAL LOW (ref 8.9–10.3)
Chloride: 100 mmol/L (ref 98–111)
Creatinine, Ser: 0.67 mg/dL (ref 0.44–1.00)
GFR, Estimated: >60 mL/min (ref >60)
Glucose, Bld: 130 mg/dL — ABNORMAL HIGH (ref 70–99)
Potassium: 3.3 mmol/L — ABNORMAL LOW (ref 3.5–5.1)
Sodium: 132 mmol/L — ABNORMAL LOW (ref 135–145)

## 2023-09-10 LAB — CBC
HCT: 36.9 % (ref 36.0–46.0)
Hemoglobin: 12.9 g/dL (ref 12.0–15.0)
MCH: 32.7 pg (ref 26.0–34.0)
MCHC: 35 g/dL (ref 30.0–36.0)
MCV: 93.4 fL (ref 80.0–100.0)
Platelets: 256 10*3/uL (ref 150–400)
RBC: 3.95 MIL/uL (ref 3.87–5.11)
RDW: 12 % (ref 11.5–15.5)
WBC: 9.4 10*3/uL (ref 4.0–10.5)
nRBC: 0 % (ref 0.0–0.2)

## 2023-09-10 LAB — HIV ANTIBODY (ROUTINE TESTING W REFLEX): HIV Screen 4th Generation wRfx: NONREACTIVE

## 2023-09-10 LAB — AMMONIA: Ammonia: 16 umol/L (ref 9–35)

## 2023-09-10 LAB — APTT: aPTT: 25 s (ref 24–36)

## 2023-09-10 LAB — PROTIME-INR
INR: 1 (ref 0.8–1.2)
Prothrombin Time: 13.8 s (ref 11.4–15.2)

## 2023-09-10 SURGERY — INSERTION, INTRAMEDULLARY ROD, TIBIA
Anesthesia: General | Site: Leg Lower | Laterality: Left

## 2023-09-10 MED ORDER — SEVOFLURANE IN SOLN
RESPIRATORY_TRACT | Status: AC
  Filled 2023-09-10: qty 250

## 2023-09-10 MED ORDER — PANTOPRAZOLE SODIUM 40 MG PO TBEC
40.0000 mg | DELAYED_RELEASE_TABLET | Freq: Every day | ORAL | Status: DC
  Administered 2023-09-10 – 2023-09-11 (×2): 40 mg via ORAL
  Filled 2023-09-10: qty 1

## 2023-09-10 MED ORDER — VITAMIN B-1 100 MG PO TABS
100.0000 mg | ORAL_TABLET | Freq: Every day | ORAL | Status: DC
  Administered 2023-09-10 – 2023-09-11 (×2): 100 mg via ORAL
  Filled 2023-09-10 (×2): qty 1

## 2023-09-10 MED ORDER — VANCOMYCIN HCL 1000 MG IV SOLR
INTRAVENOUS | Status: DC | PRN
  Administered 2023-09-10: 1 g via TOPICAL

## 2023-09-10 MED ORDER — LORAZEPAM 2 MG/ML IJ SOLN: 1.0000 mg | INTRAMUSCULAR | Status: DC | PRN

## 2023-09-10 MED ORDER — BUPIVACAINE HCL (PF) 0.5 % IJ SOLN
INTRAMUSCULAR | Status: DC | PRN
  Administered 2023-09-10: 22 mL

## 2023-09-10 MED ORDER — MIDAZOLAM HCL 2 MG/2ML IJ SOLN
INTRAMUSCULAR | Status: DC | PRN
  Administered 2023-09-10: 2 mg via INTRAVENOUS

## 2023-09-10 MED ORDER — DROPERIDOL 2.5 MG/ML IJ SOLN
INTRAMUSCULAR | Status: AC
  Filled 2023-09-10: qty 2

## 2023-09-10 MED ORDER — DEXAMETHASONE SODIUM PHOSPHATE 10 MG/ML IJ SOLN
INTRAMUSCULAR | Status: DC | PRN
  Administered 2023-09-10: 10 mg via INTRAVENOUS

## 2023-09-10 MED ORDER — TRANEXAMIC ACID-NACL 1000-0.7 MG/100ML-% IV SOLN
INTRAVENOUS | Status: DC | PRN
  Administered 2023-09-10: 1000 mg via INTRAVENOUS

## 2023-09-10 MED ORDER — VANCOMYCIN HCL 1000 MG IV SOLR
INTRAVENOUS | Status: AC
  Filled 2023-09-10: qty 20

## 2023-09-10 MED ORDER — CEFAZOLIN SODIUM-DEXTROSE 1-4 GM/50ML-% IV SOLN
1.0000 g | Freq: Three times a day (TID) | INTRAVENOUS | Status: DC
  Administered 2023-09-10 – 2023-09-11 (×2): 1 g via INTRAVENOUS

## 2023-09-10 MED ORDER — OXYCODONE HCL 5 MG PO TABS
ORAL_TABLET | ORAL | Status: AC
  Filled 2023-09-10: qty 2

## 2023-09-10 MED ORDER — MIDAZOLAM HCL 2 MG/2ML IJ SOLN
INTRAMUSCULAR | Status: AC
Start: 2023-09-10 — End: ?
  Filled 2023-09-10: qty 2

## 2023-09-10 MED ORDER — LIDOCAINE HCL (CARDIAC) PF 100 MG/5ML IV SOSY
PREFILLED_SYRINGE | INTRAVENOUS | Status: DC | PRN
  Administered 2023-09-10: 100 mg via INTRAVENOUS

## 2023-09-10 MED ORDER — HYDROMORPHONE HCL 1 MG/ML IJ SOLN
INTRAMUSCULAR | Status: AC
  Filled 2023-09-10: qty 1

## 2023-09-10 MED ORDER — KETAMINE HCL 50 MG/5ML IJ SOSY
PREFILLED_SYRINGE | INTRAMUSCULAR | Status: AC
  Filled 2023-09-10: qty 5

## 2023-09-10 MED ORDER — ONDANSETRON HCL 4 MG/2ML IJ SOLN
INTRAMUSCULAR | Status: AC
  Filled 2023-09-10: qty 2

## 2023-09-10 MED ORDER — 0.9 % SODIUM CHLORIDE (POUR BTL) OPTIME
TOPICAL | Status: DC | PRN
  Administered 2023-09-10: 500 mL

## 2023-09-10 MED ORDER — FENTANYL CITRATE (PF) 100 MCG/2ML IJ SOLN
25.0000 ug | INTRAMUSCULAR | Status: DC | PRN
  Administered 2023-09-10 (×2): 50 ug via INTRAVENOUS

## 2023-09-10 MED ORDER — BACITRACIN ZINC 500 UNIT/GM EX OINT
TOPICAL_OINTMENT | CUTANEOUS | Status: AC
  Filled 2023-09-10: qty 28.35

## 2023-09-10 MED ORDER — ENOXAPARIN SODIUM 40 MG/0.4ML IJ SOSY: 40.0000 mg | PREFILLED_SYRINGE | INTRAMUSCULAR | Status: DC

## 2023-09-10 MED ORDER — CEFAZOLIN SODIUM 1 G IJ SOLR
INTRAMUSCULAR | Status: AC
  Filled 2023-09-10: qty 30

## 2023-09-10 MED ORDER — PHENYLEPHRINE 80 MCG/ML (10ML) SYRINGE FOR IV PUSH (FOR BLOOD PRESSURE SUPPORT)
PREFILLED_SYRINGE | INTRAVENOUS | Status: DC | PRN
  Administered 2023-09-10 (×2): 80 ug via INTRAVENOUS
  Administered 2023-09-10: 120 ug via INTRAVENOUS

## 2023-09-10 MED ORDER — FOLIC ACID 1 MG PO TABS
1.0000 mg | ORAL_TABLET | Freq: Every day | ORAL | Status: DC
  Administered 2023-09-10 – 2023-09-11 (×2): 1 mg via ORAL
  Filled 2023-09-10 (×2): qty 1

## 2023-09-10 MED ORDER — FENTANYL CITRATE (PF) 100 MCG/2ML IJ SOLN
INTRAMUSCULAR | Status: AC
  Filled 2023-09-10: qty 2

## 2023-09-10 MED ORDER — VENLAFAXINE HCL ER 37.5 MG PO CP24
37.5000 mg | ORAL_CAPSULE | Freq: Every day | ORAL | Status: DC
  Administered 2023-09-11: 37.5 mg via ORAL
  Filled 2023-09-10 (×3): qty 1

## 2023-09-10 MED ORDER — SUGAMMADEX SODIUM 200 MG/2ML IV SOLN
INTRAVENOUS | Status: DC | PRN
  Administered 2023-09-10: 245 mg via INTRAVENOUS

## 2023-09-10 MED ORDER — OXYCODONE-ACETAMINOPHEN 5-325 MG PO TABS
ORAL_TABLET | ORAL | Status: AC
  Filled 2023-09-10: qty 1

## 2023-09-10 MED ORDER — TRANEXAMIC ACID-NACL 1000-0.7 MG/100ML-% IV SOLN
INTRAVENOUS | Status: AC
  Filled 2023-09-10: qty 100

## 2023-09-10 MED ORDER — PROPOFOL 10 MG/ML IV BOLUS
INTRAVENOUS | Status: AC
  Filled 2023-09-10: qty 20

## 2023-09-10 MED ORDER — SODIUM CHLORIDE 0.9 % IV SOLN: INTRAVENOUS | Status: DC | PRN

## 2023-09-10 MED ORDER — FENTANYL CITRATE (PF) 100 MCG/2ML IJ SOLN
INTRAMUSCULAR | Status: DC | PRN
  Administered 2023-09-10 (×2): 50 ug via INTRAVENOUS

## 2023-09-10 MED ORDER — PROPOFOL 10 MG/ML IV BOLUS
INTRAVENOUS | Status: DC | PRN
  Administered 2023-09-10: 200 mg via INTRAVENOUS

## 2023-09-10 MED ORDER — KETAMINE HCL 10 MG/ML IJ SOLN
INTRAMUSCULAR | Status: DC | PRN
  Administered 2023-09-10 (×2): 25 mg via INTRAVENOUS

## 2023-09-10 MED ORDER — LORAZEPAM 1 MG PO TABS
1.0000 mg | ORAL_TABLET | ORAL | Status: DC | PRN
  Administered 2023-09-10: 1 mg via ORAL
  Filled 2023-09-10: qty 1

## 2023-09-10 MED ORDER — LORAZEPAM 2 MG/ML IJ SOLN: 0.0000 mg | Freq: Four times a day (QID) | INTRAMUSCULAR | Status: DC

## 2023-09-10 MED ORDER — LORAZEPAM 2 MG/ML IJ SOLN: 0.0000 mg | Freq: Two times a day (BID) | INTRAMUSCULAR | Status: DC

## 2023-09-10 MED ORDER — POTASSIUM CHLORIDE 10 MEQ/100ML IV SOLN
10.0000 meq | INTRAVENOUS | Status: AC
  Administered 2023-09-10 (×2): 10 meq via INTRAVENOUS
  Filled 2023-09-10 (×2): qty 100

## 2023-09-10 MED ORDER — THIAMINE HCL 100 MG/ML IJ SOLN
100.0000 mg | Freq: Every day | INTRAMUSCULAR | Status: DC
  Filled 2023-09-10: qty 1

## 2023-09-10 MED ORDER — CEFAZOLIN SODIUM-DEXTROSE 1-4 GM/50ML-% IV SOLN
INTRAVENOUS | Status: AC
  Filled 2023-09-10: qty 50

## 2023-09-10 MED ORDER — ACETAMINOPHEN 10 MG/ML IV SOLN
INTRAVENOUS | Status: AC
  Filled 2023-09-10: qty 100

## 2023-09-10 MED ORDER — ENSURE ENLIVE PO LIQD
237.0000 mL | Freq: Two times a day (BID) | ORAL | Status: DC
  Administered 2023-09-11: 237 mL via ORAL
  Filled 2023-09-10 (×2): qty 237

## 2023-09-10 MED ORDER — SUCCINYLCHOLINE CHLORIDE 200 MG/10ML IV SOSY
PREFILLED_SYRINGE | INTRAVENOUS | Status: DC | PRN
  Administered 2023-09-10: 130 mg via INTRAVENOUS

## 2023-09-10 MED ORDER — DROPERIDOL 2.5 MG/ML IJ SOLN
0.6250 mg | Freq: Once | INTRAMUSCULAR | Status: AC | PRN
  Administered 2023-09-10: 0.625 mg via INTRAVENOUS

## 2023-09-10 MED ORDER — ROCURONIUM BROMIDE 100 MG/10ML IV SOLN
INTRAVENOUS | Status: DC | PRN
  Administered 2023-09-10: 20 mg via INTRAVENOUS
  Administered 2023-09-10: 10 mg via INTRAVENOUS
  Administered 2023-09-10: 20 mg via INTRAVENOUS
  Administered 2023-09-10: 40 mg via INTRAVENOUS

## 2023-09-10 MED ORDER — ADULT MULTIVITAMIN W/MINERALS CH
1.0000 | ORAL_TABLET | Freq: Every day | ORAL | Status: DC
  Administered 2023-09-10 – 2023-09-11 (×2): 1 via ORAL
  Filled 2023-09-10: qty 1

## 2023-09-10 MED ORDER — DEXMEDETOMIDINE HCL IN NACL 80 MCG/20ML IV SOLN
INTRAVENOUS | Status: DC | PRN
  Administered 2023-09-10: 8 ug via INTRAVENOUS
  Administered 2023-09-10: 12 ug via INTRAVENOUS

## 2023-09-10 MED ORDER — METHOCARBAMOL 500 MG PO TABS
ORAL_TABLET | ORAL | Status: AC
  Filled 2023-09-10: qty 1

## 2023-09-10 MED ORDER — ACETAMINOPHEN 10 MG/ML IV SOLN
INTRAVENOUS | Status: DC | PRN
  Administered 2023-09-10: 750 mg via INTRAVENOUS

## 2023-09-10 MED ORDER — DEXAMETHASONE SODIUM PHOSPHATE 10 MG/ML IJ SOLN
INTRAMUSCULAR | Status: AC
  Filled 2023-09-10: qty 1

## 2023-09-10 MED ORDER — ONDANSETRON HCL 4 MG/2ML IJ SOLN
INTRAMUSCULAR | Status: DC | PRN
  Administered 2023-09-10: 4 mg via INTRAVENOUS

## 2023-09-10 MED ORDER — BUPIVACAINE HCL (PF) 0.5 % IJ SOLN
INTRAMUSCULAR | Status: AC
  Filled 2023-09-10: qty 30

## 2023-09-10 MED ORDER — HYDROMORPHONE HCL 1 MG/ML IJ SOLN
0.5000 mg | INTRAMUSCULAR | Status: DC | PRN
  Administered 2023-09-10 (×2): 0.5 mg via INTRAVENOUS

## 2023-09-10 MED ORDER — DEXTROSE 5 % IV SOLN
INTRAVENOUS | Status: DC | PRN
  Administered 2023-09-10: 3 g via INTRAVENOUS

## 2023-09-10 MED ORDER — OXYCODONE HCL 5 MG PO TABS
10.0000 mg | ORAL_TABLET | Freq: Once | ORAL | Status: AC
  Administered 2023-09-10: 10 mg via ORAL

## 2023-09-10 SURGICAL SUPPLY — 51 items
BIT DRILL 4.0X165 AO STYLE (BIT) IMPLANT
BIT DRILL 4.0X280 (BIT) IMPLANT
BLADE SURG SZ10 CARB STEEL (BLADE) ×2 IMPLANT
BNDG ELASTIC 4INX 5YD STR LF (GAUZE/BANDAGES/DRESSINGS) ×1 IMPLANT
BNDG ELASTIC 6INX 5YD STR LF (GAUZE/BANDAGES/DRESSINGS) ×1 IMPLANT
BNDG ESMARCH 6X12 STRL LF (GAUZE/BANDAGES/DRESSINGS) ×1 IMPLANT
BNDG PLASTER FAST 4X5 WHT LF (CAST SUPPLIES) ×2 IMPLANT
BNDG PLASTER FAST 6X5 WHT LF (CAST SUPPLIES) ×2 IMPLANT
CHLORAPREP W/TINT 26 (MISCELLANEOUS) ×1 IMPLANT
CUFF TRNQT CYL 24X4X16.5-23 (TOURNIQUET CUFF) IMPLANT
CUFF TRNQT CYL 34X4.125X (TOURNIQUET CUFF) IMPLANT
DRAPE C-ARM XRAY 36X54 (DRAPES) ×1 IMPLANT
DRAPE C-ARMOR (DRAPES) ×1 IMPLANT
DRAPE SHEET LG 3/4 BI-LAMINATE (DRAPES) ×2 IMPLANT
DRAPE TABLE BACK 80X90 (DRAPES) ×1 IMPLANT
DRAPE U-SHAPE 47X51 STRL (DRAPES) ×1 IMPLANT
ELECT CAUTERY BLADE 6.4 (BLADE) ×1 IMPLANT
ELECT REM PT RETURN 9FT ADLT (ELECTROSURGICAL) ×1
ELECTRODE REM PT RTRN 9FT ADLT (ELECTROSURGICAL) ×1 IMPLANT
GAUZE SPONGE 4X4 12PLY STRL (GAUZE/BANDAGES/DRESSINGS) ×1 IMPLANT
GAUZE XEROFORM 1X8 LF (GAUZE/BANDAGES/DRESSINGS) ×1 IMPLANT
GLOVE BIO SURGEON STRL SZ8 (GLOVE) ×2 IMPLANT
GLOVE BIOGEL PI IND STRL 8 (GLOVE) ×1 IMPLANT
GLOVE INDICATOR 8.0 STRL GRN (GLOVE) ×1 IMPLANT
GLOVE PI ORTHO PRO STRL 7.5 (GLOVE) IMPLANT
GOWN STRL REUS W/ TWL LRG LVL3 (GOWN DISPOSABLE) ×1 IMPLANT
GOWN STRL REUS W/ TWL XL LVL3 (GOWN DISPOSABLE) ×1 IMPLANT
GUIDEWIRE ORTH 900X3XBALL NOSE (WIRE) IMPLANT
HANDLE YANKAUER SUCT BULB TIP (MISCELLANEOUS) ×1 IMPLANT
IMMOB KNEE 24 THIGH 24 443303 (SOFTGOODS) IMPLANT
KIT TURNOVER KIT A (KITS) ×1 IMPLANT
MANIFOLD NEPTUNE II (INSTRUMENTS) ×1 IMPLANT
NAIL TIB 9X36 (Nail) IMPLANT
NS IRRIG 1000ML POUR BTL (IV SOLUTION) ×1 IMPLANT
PACK EXTREMITY ARMC (MISCELLANEOUS) ×1 IMPLANT
PAD ABD DERMACEA PRESS 5X9 (GAUZE/BANDAGES/DRESSINGS) ×2 IMPLANT
PADDING CAST BLEND 4X4 NS (MISCELLANEOUS) ×3 IMPLANT
PADDING CAST BLEND 6X4 STRL (MISCELLANEOUS) IMPLANT
PIN FIX STEINMANN 2IN W/O HD (PIN) IMPLANT
PIN GUIDE THRD AR 3.2X330 (PIN) IMPLANT
SCREW LOCK CORT 5.0X40 (Screw) IMPLANT
SCREW LOCK CORT 5.0X50 (Screw) IMPLANT
SCREW LOCK CORT 5X36 (Screw) IMPLANT
SHEATH TIB ENTRY SP ART DISP (SHEATH) IMPLANT
SPONGE T-LAP 18X18 ~~LOC~~+RFID (SPONGE) ×1 IMPLANT
STAPLER SKIN PROX 35W (STAPLE) ×1 IMPLANT
STRAP SAFETY 5IN WIDE (MISCELLANEOUS) ×1 IMPLANT
SUT VIC AB 0 CT1 36 (SUTURE) ×1 IMPLANT
SUT VIC AB 2-0 CT1 (SUTURE) ×1 IMPLANT
TRAP FLUID SMOKE EVACUATOR (MISCELLANEOUS) ×1 IMPLANT
WATER STERILE IRR 500ML POUR (IV SOLUTION) ×1 IMPLANT

## 2023-09-10 NOTE — ED Notes (Signed)
MD at bedside. 

## 2023-09-10 NOTE — Progress Notes (Addendum)
  Progress Note   Patient: [name] GNF:[id number] DOB: [birth year] DOA: 09/09/2023     1 DOS: the patient was seen and examined on 09/10/2023   Brief hospital course: Yashasvi Manka is a 37 y.o. female with medical history significant of hypertension, GERD, depression with anxiety, obesity, alcoholic cirrhosis, migraine headaches, PTSD, seizure 2012, who presents with fall and left leg pain.  He showed communicated and displaced distal tibial and fibular metaphyseal fractures.  Patient was seen by orthopedics, scheduled for surgery.    Principal Problem:   Closed fracture of left distal tibia and fibula Active Problems:   Fall at home, initial encounter   HTN (hypertension)   Alcohol abuse   Alcoholic cirrhosis of liver (HCC)   Anxiety and depression   Obesity (BMI 30-39.9)   Assessment and Plan:  Closed fracture of left distal tibia and fibula Fall at home.: X-ray showed comminuted and displaced distal tibial and fibular metaphyseal fractures. Consulted Dr. Clemencia Course of ortho.  Patient is going to surgery today. Patient had a fall at home by accident, no syncope.  CT head without acute changes.   Hx of HTN (hypertension): not taking meds now. Bp 149/113 -IV hydralazine as needed   Alcohol abuse  Alcoholic cirrhosis of liver (HCC): Mental status normal. .,  Continue CIWA protocol.   Anxiety and depression -Effexor   Morbid obese obesity (BMI 30-39.9):  BMI 39.87 with comorbidities. I advised exercise and diet.  Obstruct sleep apnea. Patient was wearing CPAP at home, will continue every night.   Subjective:  Still complains some pain in the ankle, no short of breath.  Physical Exam: Vitals:   09/10/23 0628 09/10/23 0700 09/10/23 0732 09/10/23 0930  BP:  (!) 140/87 (!) 148/99 (!) 131/90  Pulse:  79 74 79  Resp:  15  14  Temp: 98.6 F (37 C)   98.4 F (36.9 C)  TempSrc: Oral   Oral  SpO2:  97%  99%  Weight:    122.5 kg  Height:    5\' 9"  (1.753 m)    General exam: Appears calm and comfortable  Respiratory system: Clear to auscultation. Respiratory effort normal. Cardiovascular system: S1 & S2 heard, RRR. No JVD, murmurs, rubs, gallops or clicks. No pedal edema. Gastrointestinal system: Abdomen is nondistended, soft and nontender. No organomegaly or masses felt. Normal bowel sounds heard. Central nervous system: Alert and oriented. No focal neurological deficits. Extremities: Symmetric 5 x 5 power. Skin: No rashes, lesions or ulcers Psychiatry: Judgement and insight appear normal. Mood & affect appropriate.    Data Reviewed:  Lab results, x-ray and CT scan reviewed.  Family Communication: None  Disposition: Status is: Inpatient Remains inpatient appropriate because: Patient procedure.     Time spent: 35 minutes  Author: Marrion Coy, MD 09/10/2023 10:47 AM  For on call review [email or web address].

## 2023-09-10 NOTE — Consult Note (Signed)
Orthopedic Surgery Inpatient/ED Consultation Note  Reason for consult: Left tib-fib fracture  Date of consult: 12/30 Referring provider: Marrion Coy, MD  Chief Complaint: Chief Complaint  Patient presents with   Fall   Leg Pain    HPI: [name] IS A female who is seen today regarding a complaint of left leg pain.  She had a fall down the stairs yesterday.  She had left leg pain.  She was taken to the ER where x-rays showed a distal tib-fib fracture of the left leg.  Orthopedics was consulted  Ashby Dawes: Acute Location: Left leg Intensity: Moderate Character: Sharp pain in the leg Modifying Factors: Movement Prior surgery: No Was there trauma: Yes  Her medical history is significant for hypertension, GERD, depression with anxiety, obesity, alcoholic cirrhosis, migraine headaches, PTSD.  Past Medical History: Reviewed. Past Medical History:  Diagnosis Date   Alcoholic cirrhosis of liver (HCC)    12/2011   Allergy    Anxiety    COVID-19 01/2021   Depression    Ectopic pregnancy    Frequent headaches    GERD (gastroesophageal reflux disease)    Hypertension    no issue since 2011   Seizures (HCC)    x1 in 2012    Past Surgical History: Reviewed. Past Surgical History:  Procedure Laterality Date   CHOLECYSTECTOMY  2015   COLPOSCOPY     DIAGNOSTIC LAPAROSCOPY WITH REMOVAL OF ECTOPIC PREGNANCY Right 02/13/2017   Procedure: DIAGNOSTIC LAPAROSCOPY WITH REMOVAL OF ECTOPIC PREGNANCY, right salpingectomy;  Surgeon: Linzie Collin, MD;  Location: [facility];  Service: Gynecology;  Laterality: Right;   ELBOW SURGERY Right 11/2020   ORIF HUMERUS FRACTURE Right 12/02/2020   Procedure: OPEN REDUCTION INTERNAL FIXATION (ORIF) DISTAL HUMERUS FRACTURE;  Surgeon: Bjorn Pippin, MD;  Location: [facility];  Service: Orthopedics;  Laterality: Right;    Family History: Reviewed. The family history includes Alcohol abuse in her father and mother.  Social  History: Reviewed. Social History   Socioeconomic History   Marital status: Divorced    Spouse name: Not on file   Number of children: 1   Years of education: Not on file   Highest education level: Bachelor's degree (e.g., BA, AB, BS)  Occupational History   Not on file  Tobacco Use   Smoking status: Never   Smokeless tobacco: Never  Vaping Use   Vaping status: Never Used  Substance and Sexual Activity   Alcohol use: Yes    Alcohol/week: 4.0 standard drinks of alcohol    Types: 4 Glasses of wine per week    Comment: occasionally twice a week   Drug use: No   Sexual activity: Yes  Other Topics Concern   Not on file  Social History Narrative   Not on file   Social Drivers of Health   Financial Resource Strain: Low Risk  (08/17/2023)   Received from [facility]   Overall Financial Resource Strain (CARDIA)    Difficulty of Paying Living Expenses: Not very hard  Food Insecurity: No Food Insecurity (08/17/2023)   Received from [facility]   Hunger Vital Sign    Worried About Running Out of Food in the Last Year: Never true    Ran Out of Food in the Last Year: Never true  Transportation Needs: No Transportation Needs (08/17/2023)   Received from [facility] - Transportation    In the past 12 months, has lack of transportation  kept you from medical appointments or from getting medications?: No    Lack of Transportation (Non-Medical): No  Physical Activity: Sufficiently Active (03/02/2022)   Received from [facility], [facility]   Exercise Vital Sign    Days of Exercise per Week: 4 days    Minutes of Exercise per Session: 60 min  Stress: Stress Concern Present (03/02/2022)   Received from [facility], [facility]   [employer] of Occupational Health - Occupational Stress Questionnaire    Feeling of Stress : Very much  Social Connections:  Moderately Integrated (03/02/2022)   Received from [facility], [facility]   Social Connection and Isolation Panel [NHANES]    Frequency of Communication with Friends and Family: Three times a week    Frequency of Social Gatherings with Friends and Family: Once a week    Attends Religious Services: More than 4 times per year    Active Member of [employer] or Organizations: Yes    Attends Engineer, structural: More than 4 times per year    Marital Status: Separated    Home Meds: Reviewed. Prior to Admission medications   Medication Sig Start Date End Date Taking? Authorizing Provider  omeprazole (PRILOSEC) 40 MG capsule Take 40 mg by mouth daily.   Yes [provider]  UBRELVY 100 MG TABS Take 100 mg tablet PO as a single dose. A second dose may be taken at least 2 hours after the initial dose if needed. For max dose of 200mg  in 24 hours. 04/25/22  Yes Karamalegos, Netta Neat, DO  venlafaxine XR (EFFEXOR-XR) 37.5 MG 24 hr capsule Take 37.5 mg by mouth daily with breakfast. 01/03/23 01/03/24 Yes [provider]    Hospital Medications: Reviewed. Current Facility-Administered Medications  Medication Dose Route Frequency Provider Last Rate Last Admin   acetaminophen (TYLENOL) tablet 650 mg  650 mg Oral Q6H PRN Lorretta Harp, MD       folic acid (FOLVITE) tablet 1 mg  1 mg Oral Daily Lorretta Harp, MD       hydrALAZINE (APRESOLINE) injection 5 mg  5 mg Intravenous Q2H PRN Lorretta Harp, MD       HYDROmorphone (DILAUDID) injection 1 mg  1 mg Intravenous Q4H PRN Lorretta Harp, MD   1 mg at 09/10/23 0309   lidocaine (LIDODERM) 5 % 1 patch  1 patch Transdermal Q24H Lorretta Harp, MD   1 patch at 09/10/23 0102   LORazepam (ATIVAN) injection 0-4 mg  0-4 mg Intravenous Q6H Lorretta Harp, MD       Followed by   Melene Muller ON 09/12/2023] LORazepam (ATIVAN) injection 0-4 mg  0-4 mg Intravenous Q12H Lorretta Harp, MD       LORazepam (ATIVAN) tablet 1-4 mg  1-4 mg Oral Q1H  PRN Lorretta Harp, MD   1 mg at 09/10/23 8295   Or   LORazepam (ATIVAN) injection 1-4 mg  1-4 mg Intravenous Q1H PRN Lorretta Harp, MD       methocarbamol (ROBAXIN) tablet 500 mg  500 mg Oral Q8H PRN Lorretta Harp, MD   500 mg at 09/10/23 6213   multivitamin with minerals tablet 1 tablet  1 tablet Oral Daily Lorretta Harp, MD       ondansetron [facility]) injection 4 mg  4 mg Intravenous Q8H PRN Lorretta Harp, MD       oxyCODONE-acetaminophen (PERCOCET/ROXICET) 5-325 MG per tablet 1 tablet  1 tablet Oral Q4H PRN Clyde Lundborg,  Brien Few, MD   1 tablet at 09/10/23 0447   pantoprazole (PROTONIX) EC tablet 40 mg  40 mg Oral Daily Lorretta Harp, MD       senna-docusate (Senokot-S) tablet 1 tablet  1 tablet Oral QHS PRN Lorretta Harp, MD       thiamine (VITAMIN B1) tablet 100 mg  100 mg Oral Daily Lorretta Harp, MD       Or   thiamine (VITAMIN B1) injection 100 mg  100 mg Intravenous Daily Lorretta Harp, MD       venlafaxine XR (EFFEXOR-XR) 24 hr capsule 37.5 mg  37.5 mg Oral Q breakfast Lorretta Harp, MD       Current Outpatient Medications  Medication Sig Dispense Refill   omeprazole (PRILOSEC) 40 MG capsule Take 40 mg by mouth daily.     UBRELVY 100 MG TABS Take 100 mg tablet PO as a single dose. A second dose may be taken at least 2 hours after the initial dose if needed. For max dose of 200mg  in 24 hours. 16 tablet 5   venlafaxine XR (EFFEXOR-XR) 37.5 MG 24 hr capsule Take 37.5 mg by mouth daily with breakfast.      Allergies: Reviewed. Allergies  Allergen Reactions   Bupropion Other (See Comments)    Causes seizures. Seizure     Review of Systems: General ROS: negative for - fever Psychological ROS: negative Ophthalmic ROS: negative ENT ROS: negative Allergy and Immunology ROS: negative Hematological and Lymphatic ROS: negative Endocrine ROS: negative Cardiovascular ROS: negative Gastrointestinal ROS: negative Musculoskeletal ROS: negative except HPI Neurological ROS: negative Dermatological ROS: negative  Physical  Exam: Today's Vitals   09/10/23 0430 09/10/23 0530 09/10/23 0547 09/10/23 0628  BP: (!) 144/101 (!) 147/99    Pulse: 82 73    Resp: 14 14    Temp:    98.6 F (37 C)  TempSrc:    Oral  SpO2: 94% 94%    PainSc:   3     There is no height or weight on file to calculate BMI.    General Appearance: alert, appears stated age, cooperative, in no acute distress Head: Normocephalic, without obvious abnormality, atraumatic Eyes: No scleral injection or drainage Ears: Gross hearing intact Neck: No JVD, supple, symmetrical, trachea midline Lungs: Non-labored breathing Heart: Regular rate and rhythm Extremities: Left lower extremity is in a splint.  There is no tenderness proximally at the knee.  Is no significant swelling in the knee.  She can wiggle the toes and has intact sensation to light touch.  Diffuse tenderness in the distal leg aspect.  No significant swelling.  She is comfortable on the exam.  There is no pain out of proportion appreciated. Pulses: 2+ and symmetric Skin: Normal except as noted in examination Neurologic: Grossly normal   Imaging/Diagnostics: X-rays show comminuted left tib-fib distal fractures.  A pilon fracture is not ruled out.   Labs: I have reviewed all of the pertinent labs in the patient chart. WBC  Date Value Ref Range Status  09/10/2023 9.4 4.0 - 10.5 K/uL Final   Hemoglobin  Date Value Ref Range Status  09/10/2023 12.9 12.0 - 15.0 g/dL Final   HGB  Date Value Ref Range Status  08/01/2013 13.7 12.0 - 16.0 g/dL Final   HCT  Date Value Ref Range Status  09/10/2023 36.9 36.0 - 46.0 % Final  08/01/2013 38.7 35.0 - 47.0 % Final   Sodium  Date Value Ref Range Status  09/10/2023 132 (L) 135 - 145  mmol/L Final  08/01/2013 137 136 - 145 mmol/L Final   Potassium  Date Value Ref Range Status  09/10/2023 3.3 (L) 3.5 - 5.1 mmol/L Final  08/01/2013 4.0 3.5 - 5.1 mmol/L Final   Chloride  Date Value Ref Range Status  09/10/2023 100 98 - 111 mmol/L  Final  08/01/2013 107 98 - 107 mmol/L Final   BUN  Date Value Ref Range Status  09/10/2023 18 6 - 20 mg/dL Final  16/06/9603 22 (H) 7 - 18 mg/dL Final   INR  Date Value Ref Range Status  09/10/2023 1.0 0.8 - 1.2 Final    Comment:    (NOTE) INR goal varies based on device and disease states. Performed at [facility], [address] [place], [place] [address]   12/16/2011 1.7  Final    Comment:    INR reference interval applies to patients on anticoagulant therapy. A single INR therapeutic range for coumarins is not optimal for all indications; however, the suggested range for most indications is 2.0 - 3.0. Exceptions to the INR Reference Range may include: Prosthetic heart valves, acute myocardial infarction, prevention of myocardial infarction, and combinations of aspirin and anticoagulant. The need for a higher or lower target INR must be assessed individually. Reference: The Pharmacology and Management of the Vitamin K  antagonists: the seventh ACCP Conference on Antithrombotic and Thrombolytic Therapy. Chest.2004 Sept:126 (3suppl): L7870634. A HCT value >55% may artifactually increase the PT.  In one study,  the increase was an average of 25%. Reference:  "Effect on Routine and Special Coagulation Testing Values of Citrate Anticoagulant Adjustment in Patients with High HCT Values." American Journal of Clinical Pathology 2006;126:400-405.    TSH  Date Value Ref Range Status  05/08/2019 0.78 mIU/L Final    Comment:              Reference Range .           > or = 20 Years  0.40-4.50 .                Pregnancy Ranges           First trimester    0.26-2.66           Second trimester   0.55-2.73           Third trimester    0.43-2.91        Assessment/Plan: 37 year old female with trauma and a left leg fracture of the distal tib-fib.  I discussed the nature of the injury with the patient.  To assess if there is any intra-articular involvement a CT  scan was ordered.  She is currently in the scanner now.  I explained that these injuries are treated with surgery.  These are complex injuries.  I briefly discussed the surgical strategy with her.  Her questions were answered.  She is in agreement with the plan.  Unclear at this time if surgery will happen here or if she will need transfer to [facility].  I am off service today.  Will discuss with the orthopedic team and have updated recommendations.  Please keep NPO.  Thank you.     Signed: Nada Boozer, MD, Orthopedic Surgeon Locums  12/30/20246:36 AM  Note: Dictation was assisted using commercial voice recognition software.There may be uncorrected typographical errors within the document.  Please be aware of this information when reading or considering this document. Thank you.

## 2023-09-10 NOTE — Plan of Care (Signed)
Reviewed nutritional care plan with patient

## 2023-09-10 NOTE — Progress Notes (Signed)
Orthopedics update  We are on-call to surgery this morning.  I will be performing the surgery here at [facility]   Thank you  Please keep n.p.o.    [name], ortho

## 2023-09-10 NOTE — Progress Notes (Signed)
Patient awake/alert x4.  Patient's mother at bedside.  Dinner tray provided 75% intake Medicated for discomfort as ordered.  OOB in recliner, did well with walker, NWB per provider able to toe tap. Able to get herself back in to bed without event.

## 2023-09-10 NOTE — ED Notes (Signed)
Patient to CT.

## 2023-09-10 NOTE — Anesthesia Preprocedure Evaluation (Signed)
Anesthesia Evaluation  Patient identified by MRN, date of birth, ID band Patient awake    Reviewed: Allergy & Precautions, H&P , NPO status , Patient's Chart, lab work & pertinent test results, reviewed documented beta blocker date and time   History of Anesthesia Complications Negative for: history of anesthetic complications  Airway Mallampati: IV  TM Distance: >3 FB Neck ROM: full    Dental  (+) Dental Advidsory Given, Teeth Intact   Pulmonary neg shortness of breath, sleep apnea and Continuous Positive Airway Pressure Ventilation , neg COPD, neg recent URI   Pulmonary exam normal breath sounds clear to auscultation       Cardiovascular Exercise Tolerance: Good hypertension, (-) angina (-) Past MI and (-) Cardiac Stents Normal cardiovascular exam(-) dysrhythmias (-) Valvular Problems/Murmurs Rhythm:regular Rate:Normal     Neuro/Psych  Headaches, Seizures - (One secondry to buproprion.  None since, and not on antiseizure medicine),  PSYCHIATRIC DISORDERS Anxiety Depression       GI/Hepatic Neg liver ROS,GERD  ,,  Endo/Other  neg diabetes  Class 3 obesity  Renal/GU negative Renal ROS  negative genitourinary   Musculoskeletal   Abdominal   Peds  Hematology negative hematology ROS (+)   Anesthesia Other Findings Past Medical History: No date: Alcoholic cirrhosis of liver (HCC)     Comment:  12/2011 No date: Allergy No date: Anxiety 01/2021: COVID-19 No date: Depression No date: Ectopic pregnancy No date: Frequent headaches No date: GERD (gastroesophageal reflux disease) No date: Hypertension     Comment:  no issue since 2011 No date: Seizures (HCC)     Comment:  x1 in 2012   Reproductive/Obstetrics negative OB ROS                             Anesthesia Physical Anesthesia Plan  ASA: 3  Anesthesia Plan: General   Post-op Pain Management: Regional block*   Induction:  Intravenous  PONV Risk Score and Plan: 3 and Ondansetron, Dexamethasone, Midazolam and Treatment may vary due to age or medical condition  Airway Management Planned: Oral ETT and LMA  Additional Equipment:   Intra-op Plan:   Post-operative Plan: Extubation in OR  Informed Consent: I have reviewed the patients History and Physical, chart, labs and discussed the procedure including the risks, benefits and alternatives for the proposed anesthesia with the patient or authorized representative who has indicated his/her understanding and acceptance.     Dental Advisory Given  Plan Discussed with: Anesthesiologist, CRNA and Surgeon  Anesthesia Plan Comments: (Consented for post-op nerve block)       Anesthesia Quick Evaluation

## 2023-09-10 NOTE — Anesthesia Procedure Notes (Signed)
Procedure Name: Intubation Date/Time: 09/10/2023 11:13 AM  Performed by: Deegan Valentino, [place], CRNAPre-anesthesia Checklist: Patient identified, Patient being monitored, Timeout performed, Emergency Drugs available and Suction available Patient Re-evaluated:Patient Re-evaluated prior to induction Oxygen Delivery Method: Circle system utilized Preoxygenation: Pre-oxygenation with 100% oxygen Induction Type: IV induction, Rapid sequence and Cricoid Pressure applied Ventilation: Mask ventilation without difficulty Laryngoscope Size: McGrath and 3 Grade View: Grade I Tube type: Oral Tube size: 7.0 mm Number of attempts: 1 Airway Equipment and Method: Stylet Placement Confirmation: ETT inserted through vocal cords under direct vision, positive ETCO2 and breath sounds checked- equal and bilateral Secured at: 22 cm Tube secured with: Tape Dental Injury: Teeth and Oropharynx as per pre-operative assessment

## 2023-09-10 NOTE — ED Notes (Signed)
Ortho MD at bedside.

## 2023-09-10 NOTE — Transfer of Care (Signed)
Immediate Anesthesia Transfer of Care Note  Patient: [name]  Procedure(s) Performed: INTRAMEDULLARY (IM) NAIL TIBIAL (Left: Leg Lower)  Patient Location: PACU  Anesthesia Type:General  Level of Consciousness: drowsy  Airway & Oxygen Therapy: Patient Spontanous Breathing and Patient connected to face mask oxygen  Post-op Assessment: Report given to RN and Post -op Vital signs reviewed and stable  Post vital signs: Reviewed and stable  Last Vitals:  Vitals Value Taken Time  BP 129/85 09/10/23 1339  Temp 97.5   Pulse 87 09/10/23 1344  Resp 17 09/10/23 1344  SpO2 97 % 09/10/23 1344  Vitals shown include unfiled device data.  Last Pain:  Vitals:   09/10/23 0930  TempSrc: Oral  PainSc: 0-No pain      Patients Stated Pain Goal: 0 (09/10/23 0930)  Complications: No notable events documented.

## 2023-09-10 NOTE — Progress Notes (Signed)
Dr Clemencia Course wrote consent in room with patient.

## 2023-09-10 NOTE — Progress Notes (Signed)
Initial Nutrition Assessment  DOCUMENTATION CODES:   Obesity unspecified  INTERVENTION:   -Once diet is advanced:  -Ensure Enlive po BID, each supplement provides 350 kcal and 20 grams of protein.  -Continue MVI with minerals daily -Continue 1 mg folic acid daily -Continue 034 mg thiamine daily   NUTRITION DIAGNOSIS:   Increased nutrient needs related to post-op healing as evidenced by estimated needs.  Ongoing  GOAL:   Patient will meet greater than or equal to 90% of their needs  Progressing   MONITOR:   PO intake, Supplement acceptance, Diet advancement  REASON FOR ASSESSMENT:   Consult Assessment of nutrition requirement/status, Hip fracture protocol  ASSESSMENT:   Pt with medical history significant of hypertension, GERD, depression with anxiety, obesity, alcoholic cirrhosis, migraine headaches, PTSD, seizure 2012, who presents with fall and left leg pain.  Pt admitted with closed fracture of lt distal tibia and fibula.   Per orthopedics notes, plan for surgery today. Pt currently NPO for procedure.   Pt unavailable at time of visit. Pt down in OR at time of visit. RD unable to obtain further nutrition-related history or complete nutrition-focused physical exam at this time.    Reviewed wt hx; no wt loss noted over the past 3 months.   Pt with increased nutritional needs for post-op healing and would beenfit from addition of oral nutrition supplements.   Medications reviewed and include folic acid, ativan, protonix, thiamine, and effexor.   Lab Results  Component Value Date   HGBA1C 4.5 05/08/2019   PTA DM medications are none.   Labs reviewed: Na: 132, K: 3.3, CBGS: 139 (inpatient orders for glycemic control are none). Per CareEverywhere, vitamin D and vitamin B12 WDL as of 08/17/23.   Diet Order:   Diet Order             Diet NPO time specified Except for: Sips with Meds, Ice Chips  Diet effective midnight                   EDUCATION  NEEDS:   No education needs have been identified at this time  Skin:  Skin Assessment: Skin Integrity Issues: Skin Integrity Issues:: Incisions Incisions: closed lt leg  Last BM:  Unknown  Height:   Ht Readings from Last 1 Encounters:  09/10/23 5\' 9"  (1.753 m)    Weight:   Wt Readings from Last 1 Encounters:  09/10/23 122.5 kg    Ideal Body Weight:  65.9 kg  BMI:  Body mass index is 39.87 kg/m.  Estimated Nutritional Needs:   Kcal:  1800-2000  Protein:  100-115 grams  Fluid:  > 1.8 L    Levada Schilling, RD, LDN, CDCES Registered Dietitian III Certified Diabetes Care and Education Specialist If unable to reach this RD, please use "RD Inpatient" group chat on secure chat between hours of 8am-4 pm daily

## 2023-09-10 NOTE — Op Note (Signed)
LLARY (IM) NAIL TIBIAL (Left) Intramedullary nail, fibula   SURGEON:  Surgeons and Role:    * Alexie Lanni, Adelfa Koh, MD - Primary  PHYSICIAN ASSISTANT: none  ASSISTANTS: none  ANESTHESIA:   general  EBL:  50 mL   BLOOD ADMINISTERED:none  DRAINS: none   LOCAL MEDICATIONS USED:  MARCAINE     SPECIMEN:  No Specimen  DISPOSITION OF SPECIMEN:  N/A  COUNTS:  YES  TOURNIQUET:   Total Tourniquet Time Documented: Thigh (Left) - 14 minutes Total: Thigh (Left) - 14 minutes   DICTATION: .Reubin Milan Dictation  PLAN OF CARE: Admit to inpatient   PATIENT DISPOSITION:  PACU - hemodynamically stable.   Delay start of Pharmacological VTE agent (>24hrs) due to surgical blood loss or risk of bleeding: no   The patient was checked in by the surgical team.  I met with her and discussed the plan for surgery today.  Earlier in the day met with her to discuss the nature of her injury of her left leg fractures.  She has a comminuted distal third tib-fib fractures and she has a nondisplaced ankle fracture of the posterior malleolus.  Surgery was recommended.  She had a mechanical fall down some steps and normally walks around and gets around without any ambulatory devices.  We met in the preoperative area to discuss the clinical picture and the risks and benefits pros and cons of surgery.  Questions were answered.  She desired to proceed with surgery.  She understands there is no guarantee with surgery.  After she was checked in by the surgical team she was taken to the OR.  She was given anesthesia.  She was positioned supine with the flat Jackson table.  The left lower extremity was prepped and draped in the standard sterile fashion.  Prior to that orthopedic closed reduction manipulation maneuvers were obtained and using fluoroscopy we confirmed that we able to achieve acceptable realignment of her fractures.  The prepping and draping was done.  A surgical pause was done.  Antibiotics were given.   The case then began  The tourniquet was inflated and I began with the proximal approach to the tibia.  A suprapatella technique was used.  The quadriceps tendon was incised after the skin was incised and a guide was placed through the knee joint to the anterior tibial cortex.  After confirming good position on the AP and lateral projections a guidewire was placed.  The guidewire placement was confirmed to be in a good spot.  We then opened up the cortex and placed a long tip guidewire distally.  Prior to placing across the fracture my assistant held traction and reduction maneuvers to the fracture to keep it in acceptable alignment.  The guidewire was then placed down to the physeal scar.  We measured for the insertion of a 360 mm nail.    We then let the tourniquet down and began reaming.  Starting with an 8 we reamed to a 10.5 and 0.5 mm increments.  A size 9 nail was selected.  The nail was assembled and standard techniques were used to insert the nail over the guidewire.  After passing through the narrowest part of the tibia the guidewire was pulled back and the nail was advanced distally.  We confirmed slight countersink position of the nail proximally and good placement of the nail distally.  The alignment of the nail and alignment of the leg all looked good.  We then put 2 proximal interlocking screws and.  This was through the targeting device.  1 was medial to lateral the other 1 was lateral to medial.  Standard AO technique and fluoroscopic guidance was used.  We then placed 2 medial to lateral distal interlocks using perfect circle technique and standard AO technique and fluoroscopic guidance.  Both of those screws had good purchase and wearing good positioning relative to the fracture.  Both screws were distal to the fracture.  We opted not to place an anterior to posterior screw.  Careful evaluation of the x-rays at this time confirmed that the tibia fracture was a near anatomical alignment.   The fibular fracture was still a little bit displaced.  The posterior malleolus fracture was still nondisplaced.  I opted for fixation of the distal fibular fracture.  A 2.0 Steinmann pin was percutaneously passed through the skin and using fluoroscopic guidance advanced through the shaft of the fibular after close reduction maneuvers were done.  The wire was felt to be in the canal.  Fluoroscopy confirmed to be in the canal and the fibula alignment was significantly improved and in acceptable position.  There was a lot of comminution in the fibula in that area.  I was pleased with the overall positioning.  Final x-rays of the ankle and the tib-fib showed near anatomical realignment of the tibia and good alignment of the fibula as well as a nondisplaced posterior mall fracture.  We are pleased with the fluoroscopy images.  They were saved.  A bulky bandage was placed after the wound was copiously irrigated and closed in layers with skin staples at the surface.  He tolerated the procedure well.  There were no complications  Should be readmitted back to the hospitalist team.  She will be nonweightbearing the left lower extremity.  PT orders will be placed.  Should be able to be discharged home once her pain is controlled, she ambulates safely and nonweightbearing status with the therapy team and her pain is controlled with medication and she has no trouble eating or peeing.  This could happen as early as tomorrow   Thank you

## 2023-09-10 NOTE — Progress Notes (Signed)
Ortho Update   Ordered Lovenox to start 24h after surgery.   Thanks   Nichelle Renwick, ortho

## 2023-09-10 NOTE — Brief Op Note (Signed)
09/10/2023  2:39 PM  PATIENT:  [name]  37 y.o. female  PRE-OPERATIVE DIAGNOSIS: Left tib-fib fracture, left ankle fracture  POST-OPERATIVE DIAGNOSIS: Left tib-fib fracture, left ankle fracture  PROCEDURE:  Procedure(s): INTRAMEDULLARY (IM) NAIL TIBIAL (Left) Intramedullary nail, fibula   SURGEON:  Surgeons and Role:    * Jasmain Ahlberg, Adelfa Koh, MD - Primary  PHYSICIAN ASSISTANT: none  ASSISTANTS: none  ANESTHESIA:   general  EBL:  50 mL   BLOOD ADMINISTERED:none  DRAINS: none   LOCAL MEDICATIONS USED:  MARCAINE     SPECIMEN:  No Specimen  DISPOSITION OF SPECIMEN:  N/A  COUNTS:  YES  TOURNIQUET:   Total Tourniquet Time Documented: Thigh (Left) - 14 minutes Total: Thigh (Left) - 14 minutes   DICTATION: .Reubin Milan Dictation  PLAN OF CARE: Admit to inpatient   PATIENT DISPOSITION:  PACU - hemodynamically stable.   Delay start of Pharmacological VTE agent (>24hrs) due to surgical blood loss or risk of bleeding: no   The patient was checked in by the surgical team.  I met with her and discussed the plan for surgery today.  Earlier in the day met with her to discuss the nature of her injury of her left leg fractures.  She has a comminuted distal third tib-fib fractures and she has a nondisplaced ankle fracture of the posterior malleolus.  Surgery was recommended.  She had a mechanical fall down some steps and normally walks around and gets around without any ambulatory devices.  We met in the preoperative area to discuss the clinical picture and the risks and benefits pros and cons of surgery.  Questions were answered.  She desired to proceed with surgery.  She understands there is no guarantee with surgery.  After she was checked in by the surgical team she was taken to the OR.  She was given anesthesia.  She was positioned supine with the flat Jackson table.  The left lower extremity was prepped and draped in the standard sterile fashion.  Prior to that  orthopedic closed reduction manipulation maneuvers were obtained and using fluoroscopy we confirmed that we able to achieve acceptable realignment of her fractures.  The prepping and draping was done.  A surgical pause was done.  Antibiotics were given.  The case then began  The tourniquet was inflated and I began with the proximal approach to the tibia.  A suprapatella technique was used.  The quadriceps tendon was incised after the skin was incised and a guide was placed through the knee joint to the anterior tibial cortex.  After confirming good position on the AP and lateral projections a guidewire was placed.  The guidewire placement was confirmed to be in a good spot.  We then opened up the cortex and placed a long tip guidewire distally.  Prior to placing across the fracture my assistant held traction and reduction maneuvers to the fracture to keep it in acceptable alignment.  The guidewire was then placed down to the physeal scar.  We measured for the insertion of a 360 mm nail.    We then let the tourniquet down and began reaming.  Starting with an 8 we reamed to a 10.5 and 0.5 mm increments.  A size 9 nail was selected.  The nail was assembled and standard techniques were used to insert the nail over the guidewire.  After passing through the narrowest part of the tibia the guidewire was pulled back and the nail was advanced distally.  We confirmed slight countersink  position of the nail proximally and good placement of the nail distally.  The alignment of the nail and alignment of the leg all looked good.  We then put 2 proximal interlocking screws and.  This was through the targeting device.  1 was medial to lateral the other 1 was lateral to medial.  Standard AO technique and fluoroscopic guidance was used.  We then placed 2 medial to lateral distal interlocks using perfect circle technique and standard AO technique and fluoroscopic guidance.  Both of those screws had good purchase and wearing  good positioning relative to the fracture.  Both screws were distal to the fracture.  We opted not to place an anterior to posterior screw.  Careful evaluation of the x-rays at this time confirmed that the tibia fracture was a near anatomical alignment.  The fibular fracture was still a little bit displaced.  The posterior malleolus fracture was still nondisplaced.  I opted for fixation of the distal fibular fracture.  A 2.0 Steinmann pin was percutaneously passed through the skin and using fluoroscopic guidance advanced through the shaft of the fibular after close reduction maneuvers were done.  The wire was felt to be in the canal.  Fluoroscopy confirmed to be in the canal and the fibula alignment was significantly improved and in acceptable position.  There was a lot of comminution in the fibula in that area.  I was pleased with the overall positioning.  Final x-rays of the ankle and the tib-fib showed near anatomical realignment of the tibia and good alignment of the fibula as well as a nondisplaced posterior mall fracture.  We are pleased with the fluoroscopy images.  They were saved.  A bulky bandage was placed after the wound was copiously irrigated and closed in layers with skin staples at the surface.  He tolerated the procedure well.  There were no complications  Should be readmitted back to the hospitalist team.  She will be nonweightbearing the left lower extremity.  PT orders will be placed.  Should be able to be discharged home once her pain is controlled, she ambulates safely and nonweightbearing status with the therapy team and her pain is controlled with medication and she has no trouble eating or peeing.  This could happen as early as tomorrow   Thank you

## 2023-09-10 NOTE — ED Notes (Signed)
Patient to OR

## 2023-09-11 ENCOUNTER — Telehealth: Payer: Medicaid Other | Admitting: Psychiatry

## 2023-09-11 DIAGNOSIS — S82875A Nondisplaced pilon fracture of left tibia, initial encounter for closed fracture: Secondary | ICD-10-CM | POA: Diagnosis not present

## 2023-09-11 DIAGNOSIS — I10 Essential (primary) hypertension: Secondary | ICD-10-CM | POA: Diagnosis not present

## 2023-09-11 DIAGNOSIS — K7030 Alcoholic cirrhosis of liver without ascites: Secondary | ICD-10-CM | POA: Diagnosis not present

## 2023-09-11 DIAGNOSIS — F3342 Major depressive disorder, recurrent, in full remission: Secondary | ICD-10-CM

## 2023-09-11 LAB — BASIC METABOLIC PANEL
Anion gap: 8 (ref 5–15)
BUN: 9 mg/dL (ref 6–20)
CO2: 24 mmol/L (ref 22–32)
Calcium: 8.9 mg/dL (ref 8.9–10.3)
Chloride: 103 mmol/L (ref 98–111)
Creatinine, Ser: 0.62 mg/dL (ref 0.44–1.00)
GFR, Estimated: >60 mL/min (ref >60)
Glucose, Bld: 122 mg/dL — ABNORMAL HIGH (ref 70–99)
Potassium: 4.1 mmol/L (ref 3.5–5.1)
Sodium: 135 mmol/L (ref 135–145)

## 2023-09-11 LAB — CBC
HCT: 35.6 % — ABNORMAL LOW (ref 36.0–46.0)
Hemoglobin: 12.3 g/dL (ref 12.0–15.0)
MCH: 32.3 pg (ref 26.0–34.0)
MCHC: 34.6 g/dL (ref 30.0–36.0)
MCV: 93.4 fL (ref 80.0–100.0)
Platelets: 246 10*3/uL (ref 150–400)
RBC: 3.81 MIL/uL — ABNORMAL LOW (ref 3.87–5.11)
RDW: 12.1 % (ref 11.5–15.5)
WBC: 10.7 10*3/uL — ABNORMAL HIGH (ref 4.0–10.5)
nRBC: 0 % (ref 0.0–0.2)

## 2023-09-11 LAB — PHOSPHORUS: Phosphorus: 2.8 mg/dL (ref 2.5–4.6)

## 2023-09-11 LAB — MAGNESIUM: Magnesium: 2.1 mg/dL (ref 1.7–2.4)

## 2023-09-11 MED ORDER — HYDROMORPHONE HCL 1 MG/ML IJ SOLN
INTRAMUSCULAR | Status: AC
  Filled 2023-09-11: qty 1

## 2023-09-11 MED ORDER — OXYCODONE-ACETAMINOPHEN 5-325 MG PO TABS: 1.0000 | ORAL_TABLET | ORAL | 0 refills | Status: DC | PRN

## 2023-09-11 MED ORDER — OXYCODONE-ACETAMINOPHEN 5-325 MG PO TABS
ORAL_TABLET | ORAL | Status: AC
  Filled 2023-09-11: qty 1

## 2023-09-11 MED ORDER — ADULT MULTIVITAMIN W/MINERALS CH
ORAL_TABLET | ORAL | Status: AC
Start: 2023-09-11 — End: ?
  Filled 2023-09-11: qty 1

## 2023-09-11 MED ORDER — CEFAZOLIN SODIUM-DEXTROSE 1-4 GM/50ML-% IV SOLN
INTRAVENOUS | Status: AC
  Filled 2023-09-11: qty 50

## 2023-09-11 MED ORDER — ASPIRIN 81 MG PO TBEC: 81.0000 mg | DELAYED_RELEASE_TABLET | Freq: Two times a day (BID) | ORAL | 0 refills | Status: AC

## 2023-09-11 MED ORDER — PANTOPRAZOLE SODIUM 40 MG PO TBEC
DELAYED_RELEASE_TABLET | ORAL | Status: AC
  Filled 2023-09-11: qty 1

## 2023-09-11 NOTE — TOC Initial Note (Signed)
 Transition of Care [facility]) - Initial/Assessment Note    Patient Details  Name: [name] MRN: [id number] Date of Birth: 05-Dec-1985  [facility] CM/SW Contact:    Royanne JINNY Bernheim, RN Phone Number: 09/11/2023, 8:48 AM  Clinical Narrative:                 [facility] unable to accept the patient for [facility] at this time,  [facility] in[facility] to accept the patient for [facility] at this time [facility] unable to accept the patient for [facility] at this time [facility] is not able to accept the patient for [facility] at this time [facility] unable to accept the patient for [facility] at this time [facility] unable to accept the patient for [facility] at this time [facility] unable to accept the patient for [facility] at this time The patient will need to go to Outpatient Therapy if needed, notified the nurse and physician and added the information to the AVS to print at DC Added resources for Housing as well as Substance use to the AVS to support SDOH    Expected Discharge Plan: OP Rehab Barriers to Discharge: No Barriers Identified   Patient Goals and CMS Choice            Expected Discharge Plan and Services   Discharge Planning Services: CM Consult                     DME Arranged: 3-N-1, Walker rolling DME Agency: [employer] Date DME Agency Contacted: 09/11/23 Time DME Agency Contacted: [phone] Representative spoke with at DME Agency: [facility] Arranged: NA          Prior Living Arrangements/Services     Patient language and need for interpreter reviewed:: Yes Do you feel safe going back to the place where you live?: Yes      Need for Family Participation in Patient Care: Yes (Comment) Care giver support system in place?: Yes (comment)   Criminal Activity/Legal Involvement Pertinent to Current Situation/Hospitalization: No - Comment as needed  Activities of Daily Living   ADL Screening (condition at time of admission) Independently performs ADLs?: Yes (appropriate for developmental age) Is the patient deaf  or have difficulty hearing?: No Does the patient have difficulty seeing, even when wearing glasses/contacts?: No Does the patient have difficulty concentrating, remembering, or making decisions?: No  Permission Sought/Granted   Permission granted to share information with : Yes, Verbal Permission Granted              Emotional Assessment Appearance:: Appears stated age   Affect (typically observed): Accepting Orientation: : Oriented to Self, Oriented to Place, Oriented to  Time, Oriented to Situation Alcohol / Substance Use: Alcohol Use Psych Involvement: No (comment)  Admission diagnosis:  Closed fracture of left distal tibia [S82.302A] Closed fracture of left tibia and fibula, initial encounter [D17.797J, S82.402A] Patient Active Problem List   Diagnosis Date Noted   Closed fracture of left distal tibia and fibula 09/09/2023   Anxiety and depression 09/09/2023   Obesity (BMI 30-39.9) 09/09/2023   Fall at home, initial encounter 09/09/2023   HTN (hypertension) 09/09/2023   Alcohol abuse 09/09/2023   Alcoholic cirrhosis of liver (HCC)    MDD (major depressive disorder), recurrent, in full remission (HCC) 02/12/2023   Chronic post-traumatic stress disorder (PTSD) 02/12/2023   Fatigue 02/12/2023   Snoring 02/12/2023   History of abnormal cervical Pap smear 04/01/2019   History of ectopic pregnancy 04/01/2019   Migraine with aura and without status  migrainosus, not intractable 04/01/2019   PCP:  Alla Amis, MD Pharmacy:   [id number] - [place], [place] - [address] AT [facility] [address] [facility] [address] [place] [address] [place] [address] Phone: [phone] Fax: [phone]     Social Drivers of Health (SDOH) Social History: SDOH Screenings   Food Insecurity: No Food Insecurity (09/10/2023)  Housing: High Risk (09/10/2023)  Transportation Needs: No Transportation Needs (09/10/2023)  Utilities: Not At Risk (09/10/2023)  Alcohol  Screen: Low Risk  (04/19/2022)  Depression (PHQ2-9): Low Risk  (02/12/2023)  Financial Resource Strain: Low Risk  (08/17/2023)   [facility]  Physical Activity: Sufficiently Active (03/02/2022)   [facility]  Social Connections: Moderately Integrated (03/02/2022)   [facility], [facility]  Stress: Stress Concern Present (03/02/2022)   Received from [facility], [facility]  Tobacco Use: Low Risk  (09/10/2023)   SDOH Interventions: Housing Interventions: Inpatient TOC   Readmission Risk Interventions     No data to display

## 2023-09-11 NOTE — Plan of Care (Signed)
 documented

## 2023-09-11 NOTE — Progress Notes (Signed)
 Patient contacted this AM , she is hospitalized.

## 2023-09-11 NOTE — Anesthesia Postprocedure Evaluation (Signed)
 Anesthesia Post Note  Patient: [name]  Procedure(s) Performed: INTRAMEDULLARY (IM) NAIL TIBIAL (Left: Leg Lower)  Patient location during evaluation: PACU Anesthesia Type: General Level of consciousness: awake and alert Pain management: pain level controlled Vital Signs Assessment: post-procedure vital signs reviewed and stable Respiratory status: spontaneous breathing, nonlabored ventilation, respiratory function stable and patient connected to nasal cannula oxygen Cardiovascular status: blood pressure returned to baseline and stable Postop Assessment: no apparent nausea or vomiting Anesthetic complications: no   No notable events documented.   Last Vitals:  Vitals:   09/11/23 0617 09/11/23 0927  BP: (!) 143/96 (!) 131/90  Pulse: 92 91  Resp: 16 15  Temp:  36.7 C  SpO2: 98% 93%    Last Pain:  Vitals:   09/11/23 0927  TempSrc: Oral  PainSc:                  [name]

## 2023-09-11 NOTE — Progress Notes (Signed)
 Patient is not able to walk the distance required to go the bathroom, or he/she is unable to safely negotiate stairs required to access the bathroom.  A 3in1 BSC will alleviate this problem

## 2023-09-11 NOTE — Plan of Care (Signed)
   Problem: Activity: Goal: Risk for activity intolerance will decrease Outcome: Progressing   Problem: Elimination: Goal: Will not experience complications related to urinary retention Outcome: Progressing   Problem: Pain Management: Goal: General experience of comfort will improve Outcome: Progressing

## 2023-09-11 NOTE — Evaluation (Signed)
 Physical Therapy Evaluation Patient Details Name: [name] MRN: [id number] DOB: 12-26-1985 Today's Date: 09/11/2023  History of Present Illness  Patient is a 37 year old female with fall down the stairs. s/p stabilization of left tib-fib fractures and left ankle fracture. History of  hypertension, GERD, depression with anxiety, obesity, alcoholic cirrhosis, migraine headaches, PTSD.   Clinical Impression  PT evaluation completed. Patient is independent at baseline and works from home. She lives with her 84 year old child in a home with no steps to enter. Bedrooms are upstairs but there is a living area and 1/2 bath on the main floor.   Mobility training initiated with transfers, gait, and stair training. Patient is fatigued with mobility and requires intermittent rest breaks with activity. No physical assistance is required for bed mobility or transfers from various surfaces including, bed, recliner, and chair with arms. Education provided on importance to maintain NWB with functional activity and patient able to maintain with ambulation. To simulate safest method to navigate the 14 steps to her upstairs bedroom, seated/boosting method attempted. The patient is able to maintain NWB, but requires frequent rest breaks. Min A required for management of LLE for comfort and for standing to transition to the rolling walker.  She will require the assistance from a caregiver to safely get up/down the stairs at this time. The patient is planning to stay on the first floor of her home initially with no steps required. Recommend to continue PT after this hospital stay to progress activity tolerance and to safely manage stairs in her home environment.     If plan is discharge home, recommend the following: A little help with walking and/or transfers;A little help with bathing/dressing/bathroom;Assist for transportation;Help with stairs or ramp for entrance;Assistance with cooking/housework   Can travel by  private [employer] (2 wheels);BSC/3in1  Recommendations for Other Services       Functional Status Assessment Patient has had a recent decline in their functional status and demonstrates the ability to make significant improvements in function in a reasonable and predictable amount of time.     Precautions / Restrictions Precautions Precautions: Fall Restrictions Weight Bearing Restrictions Per Provider Order: Yes LLE Weight Bearing Per Provider Order: Touchdown weight bearing Other Position/Activity Restrictions: knee immobilizer for comfort per notes      Mobility  Bed Mobility Overal bed mobility: Needs Assistance Bed Mobility: Supine to Sit     Supine to sit: Supervision     General bed mobility comments: increased time, no physical assistance needed. patient requesting to don knee immobilizer for comfort    Transfers Overall transfer level: Needs assistance Equipment used: Rolling walker (2 wheels) Transfers: Sit to/from Stand Sit to Stand: Supervision           General transfer comment: cues for LLE positioning and hand placement for safety. no physical assistance required to stand from bed, from recliner, from chair with arms    Ambulation/Gait Ambulation/Gait assistance: Supervision Gait Distance (Feet): 50 Feet Assistive device: Rolling walker (2 wheels) Gait Pattern/deviations: Step-to pattern       General Gait Details: patient able to maintain NWB with ambultaion. she walked in the hallway with one standing rest break due to leg and arm fatigue. reinforcement of proper use of rolling walker and importance to maintain NWB  Stairs Stairs: Yes Stairs assistance: Min assist Stair Management: Backwards, Forwards, Seated/boosting, One rail Left Number of Stairs: 4 General stair comments: patient  performed seated/boosting up/down the stairs with cues for technique to maintain NWB of LLE. unable to stand  initially once stairs completed. with rest breaks, she required Min A to stand and transition back to the rolling walker. education provided on need for physical assistance for stairs at this time and alternate ways to go up/down the steps maintaining NWB with 2 rails. she is planning to stay downstairs at her home with no steps to enter.  Wheelchair Mobility     Tilt Bed    Modified Rankin (Stroke Patients Only)       Balance Overall balance assessment: Needs assistance Sitting-balance support: Feet supported Sitting balance-Leahy Scale: Good     Standing balance support: Bilateral upper extremity supported Standing balance-Leahy Scale: Fair Standing balance comment: reliance on rolling walker for support in standing                             Pertinent Vitals/Pain Pain Assessment Pain Assessment: 0-10 Pain Score: 8  Pain Location: LLE Pain Descriptors / Indicators: Discomfort Pain Intervention(s): Limited activity within patient's tolerance, Premedicated before session, Monitored during session, Repositioned (encouraged ice)    Home Living Family/patient expects to be discharged to:: Private residence Living Arrangements: Children (42 year old child) Available Help at Discharge: Family;Available PRN/intermittently Type of Home: House Home Access: Level entry     Alternate Level Stairs-Number of Steps: 2, landing, 14 more Home Layout: Two level;1/2 bath on main level;Bed/bath upstairs Home Equipment: None      Prior Function Prior Level of Function : Independent/Modified Independent;Driving;Working/employed             Mobility Comments: independent, works from home       Extremity/Trunk Assessment   Upper Extremity Assessment Upper Extremity Assessment: Overall WFL for tasks assessed    Lower Extremity Assessment Lower Extremity Assessment: LLE deficits/detail LLE Deficits / Details: patient able to wiggle toes, flex knee partially, and  activate hip movement       Communication   Communication Communication: No apparent difficulties  Cognition Arousal: Alert Behavior During Therapy: WFL for tasks assessed/performed Overall Cognitive Status: Within Functional Limits for tasks assessed                                          General Comments General comments (skin integrity, edema, etc.): patient is fatigued with mobility and requires rest breaks intermittently    Exercises     Assessment/Plan    PT Assessment Patient needs continued PT services  PT Problem List Decreased strength;Decreased activity tolerance;Decreased range of motion;Decreased mobility;Decreased balance;Decreased safety awareness;Decreased knowledge of precautions       PT Treatment Interventions DME instruction;Gait training;Stair training;Functional mobility training;Therapeutic activities;Patient/family education;Wheelchair mobility training;Therapeutic exercise;Balance training    PT Goals (Current goals can be found in the Care Plan section)  Acute Rehab PT Goals Patient Stated Goal: to return home PT Goal Formulation: With patient Time For Goal Achievement: 09/25/23 Potential to Achieve Goals: Fair    Frequency Min 1X/week     Co-evaluation               AM-PAC PT 6 Clicks Mobility  Outcome Measure Help needed turning from your back to your side while in a flat bed without using bedrails?: None Help needed moving from lying on your back to sitting on the side of  a flat bed without using bedrails?: A Little Help needed moving to and from a bed to a chair (including a wheelchair)?: A Little Help needed standing up from a chair using your arms (e.g., wheelchair or bedside chair)?: A Little Help needed to walk in hospital room?: A Little Help needed climbing 3-5 steps with a railing? : A Little 6 Click Score: 19    End of Session Equipment Utilized During Treatment: Gait belt Activity Tolerance: Patient  limited by fatigue Patient left: in chair;with call bell/phone within reach Nurse Communication: Mobility status PT Visit Diagnosis: Other abnormalities of gait and mobility (R26.89);Difficulty in walking, not elsewhere classified (R26.2)    Time: 9164-9070 PT Time Calculation (min) (ACUTE ONLY): 54 min   Charges:   PT Evaluation $PT Eval Low Complexity: 1 Low PT Treatments $Gait Training: 23-37 mins $Therapeutic Activity: 8-22 mins PT General Charges $$ ACUTE PT VISIT: 1 Visit         Randine Essex, PT, MPT   Randine LULLA Essex 09/11/2023, 11:04 AM

## 2023-09-11 NOTE — Discharge Summary (Signed)
 Physician Discharge Summary   Patient: [name] MRN: [id number] DOB: Oct 25, 1985  Admit date:     09/09/2023  Discharge date: 09/11/23  Discharge Physician: Murvin Mana   PCP: Alla Amis, MD   Recommendations at discharge:   Follow-up with PCP in 1 week. Follow-up with orthopedics in 2 weeks.  Discharge Diagnoses: Principal Problem:   Closed fracture of left distal tibia and fibula Active Problems:   Fall at home, initial encounter   HTN (hypertension)   Alcohol abuse   Alcoholic cirrhosis of liver (HCC)   Anxiety and depression   Obesity (BMI 30-39.9)  Resolved Problems:   * No resolved hospital problems. * Hyponatremia.  Hospital Course: [name] is a 37 y.o. female with medical history significant of hypertension, GERD, depression with anxiety, obesity, alcoholic cirrhosis, migraine headaches, PTSD, seizure 2012, who presents with fall and left leg pain.  He showed communicated and displaced distal tibial and fibular metaphyseal fractures.  Patient was seen by orthopedics, had surgery with intramedullary nail of fibula and tibial 12/30.  Postoperatively, patient doing well.  Medically stable for discharge with outpatient PT/OT.   Assessment and Plan: Closed fracture of left distal tibia and fibula Fall at home.: X-ray showed comminuted and displaced distal tibial and fibular metaphyseal fractures. Consulted Dr. Zafonte of ortho.  Patient had a fall at home by accident, no syncope.  CT head without acute changes. Patient is status post surgery, doing well.  Surgery has cleared patient for discharge.   Essential HTN (hypertension):  Follow-up with PCP as outpatient.   Alcohol abuse  Alcoholic cirrhosis of liver (HCC): Mental status normal.  Anxiety and depression -Effexor    Morbid obese obesity (BMI 30-39.9):  BMI 39.87 with comorbidities. I advised exercise and diet.   Obstruct sleep apnea. Patient was wearing CPAP at home        Consultants: Orthopedics. Procedures performed: Ankle surgery. Disposition: Home Diet recommendation:  Discharge Diet Orders (From admission, onward)     Start     Ordered   09/11/23 0000  Diet - low sodium heart healthy        09/11/23 1001           Cardiac diet DISCHARGE MEDICATION: Allergies as of 09/11/2023       Reactions   Bupropion Other (See Comments)   Causes seizures. Seizure        Medication List     TAKE these medications    aspirin  EC 81 MG tablet Take 1 tablet (81 mg total) by mouth in the morning and at bedtime. Swallow whole.   omeprazole 40 MG capsule Commonly known as: PRILOSEC Take 40 mg by mouth daily.   oxyCODONE -acetaminophen  5-325 MG tablet Commonly known as: PERCOCET/ROXICET Take 1 tablet by mouth every 4 (four) hours as needed for moderate pain (pain score 4-6).   Ubrelvy  100 MG Tabs Generic drug: Ubrogepant  Take 100 mg tablet PO as a single dose. A second dose may be taken at least 2 hours after the initial dose if needed. For max dose of 200mg  in 24 hours.   venlafaxine  XR 37.5 MG 24 hr capsule Commonly known as: EFFEXOR -XR Take 37.5 mg by mouth daily with breakfast.               Durable Medical Equipment  (From admission, onward)           Start     Ordered   09/11/23 0845  For home use only DME Walker rolling  Once       Question Answer Comment  Walker: With 5 Inch Wheels   Patient needs a walker to treat with the following condition Impaired mobility      09/11/23 0844   09/11/23 0845  For home use only DME Bedside commode  Once       Question:  Patient needs a bedside commode to treat with the following condition  Answer:  Impaired mobility   09/11/23 0844            Follow-up Information     Alla Amis, MD Follow up in 1 week(s).   Specialty: Family Medicine Contact information: [address] [facility] [place] [address] [phone]         Robbin Redell Ned, MD Follow up in 2 week(s).   Specialty: Orthopedic Surgery Contact information: [address] [place] [address] [phone]                Discharge Exam: Fredricka Weights   09/10/23 0930  Weight: 122.5 kg   General exam: Appears calm and comfortable  Respiratory system: Clear to auscultation. Respiratory effort normal. Cardiovascular system: S1 & S2 heard, RRR. No JVD, murmurs, rubs, gallops or clicks. No pedal edema. Gastrointestinal system: Abdomen is nondistended, soft and nontender. No organomegaly or masses felt. Normal bowel sounds heard. Central nervous system: Alert and oriented. No focal neurological deficits. Extremities: Symmetric 5 x 5 power. Skin: No rashes, lesions or ulcers Psychiatry: Judgement and insight appear normal. Mood & affect appropriate.    Condition at discharge: good  The results of significant diagnostics from this hospitalization (including imaging, microbiology, ancillary and laboratory) are listed below for reference.   Imaging Studies: DG Ankle Complete Left Result Date: 09/10/2023 CLINICAL DATA:  Status post ORIF tibial and fibular fractures EXAM: LEFT ANKLE COMPLETE - 3+ VIEW COMPARISON:  Intraoperative films from earlier in the same day. FINDINGS: Fractures are again noted following operative fixation. Near anatomic alignment of the fracture fragments is noted. No new acute bony abnormality is seen. IMPRESSION: Status post ORIF of distal tibial and fibular fractures. Electronically Signed   By: Oneil Devonshire M.D.   On: 09/10/2023 17:21   DG Tibia/Fibula Left Result Date: 09/10/2023 CLINICAL DATA:  Status post ORIF tibial and fibular fractures EXAM: LEFT TIBIA AND FIBULA - 2 VIEW COMPARISON:  Intraoperative films from earlier in the same day. FINDINGS: Medullary rod is again seen in the tibia with proximal and distal fixation screws. Fracture fragments are in near anatomic alignment. Medullary pin is noted  within the mid and distal fibula with near anatomic alignment of the fibular fracture fragments. No new bony abnormality is seen. IMPRESSION: ORIF of distal tibial and fibular fractures. Electronically Signed   By: Oneil Devonshire M.D.   On: 09/10/2023 17:13   DG Tibia/Fibula Left Result Date: 09/10/2023 CLINICAL DATA:  Distal tibial and fibular fractures EXAM: LEFT TIBIA AND FIBULA - 2 VIEW COMPARISON:  Films from the previous day. FLUOROSCOPY TIME:  Radiation Exposure Index (as provided by the fluoroscopic device): 20.24 mGy If the device does not provide the exposure index: Fluoroscopy Time:  5 minutes 51 seconds Number of Acquired Images:  7 FINDINGS: Intramedullary rod is noted within the tibia. Proximal and distal fixation screws are seen. Fracture fragments are in near anatomic alignment. Additionally a medullary pin is noted within the mid and distal fibula. IMPRESSION: ORIF of distal tibial and fibular fractures. Electronically Signed   By:  Oneil Devonshire M.D.   On: 09/10/2023 17:12   DG C-Arm 1-60 Min-No Report Result Date: 09/10/2023 Fluoroscopy was utilized by the requesting physician.  No radiographic interpretation.   DG C-Arm 1-60 Min-No Report Result Date: 09/10/2023 Fluoroscopy was utilized by the requesting physician.  No radiographic interpretation.   CT ANKLE LEFT WO CONTRAST Result Date: 09/10/2023 CLINICAL DATA:  Left lower leg injury. EXAM: CT OF THE LEFT ANKLE WITHOUT CONTRAST TECHNIQUE: Multidetector CT imaging of the left ankle was performed according to the standard protocol. Multiplanar CT image reconstructions were also generated. RADIATION DOSE REDUCTION: This exam was performed according to the departmental dose-optimization program which includes automated exposure control, adjustment of the mA and/or kV according to patient size and/or use of iterative reconstruction technique. COMPARISON:  Left tibia and fibula x-rays from yesterday. FINDINGS: Bones/Joint/Cartilage Acute  comminuted fracture of the distal tibia metadiaphysis again noted. 5 mm lateral displacement, 6 mm posterior displacement, and 8 mm impaction of the dominant fragments. Separate acute nondisplaced longitudinal fracture of the posterior malleolus. Intact medial malleolus. Acute comminuted fracture of the distal fibula again noted. 14 mm posterior displacement and 14 mm impaction of the dominant fragments with mild apex medial angulation. The fracture extends to the anterior aspect of the lateral malleolus. The ankle mortise is symmetric. The talar dome is intact. Joint spaces are preserved. Small tibiotalar joint effusion. Ligaments Ligaments are suboptimally evaluated by CT. Muscles and Tendons Grossly intact. Soft tissue Prominent lateral lower leg soft tissue swelling. No fluid collection or hematoma. No soft tissue mass. IMPRESSION: 1. Acute comminuted, displaced, and impacted fracture of the distal tibia metadiaphysis. 2. Separate acute nondisplaced longitudinal fracture of the posterior malleolus. 3. Acute comminuted, displaced, and impacted fracture of the distal fibula. Electronically Signed   By: Elsie ONEIDA Shoulder M.D.   On: 09/10/2023 08:21   CT HEAD WO CONTRAST ( ) Result Date: 09/10/2023 CLINICAL DATA:  Head trauma, moderate-severe\ Fall walking down stairs. EXAM: CT HEAD WITHOUT CONTRAST TECHNIQUE: Contiguous axial images were obtained from the base of the skull through the vertex without intravenous contrast. RADIATION DOSE REDUCTION: This exam was performed according to the departmental dose-optimization program which includes automated exposure control, adjustment of the mA and/or kV according to patient size and/or use of iterative reconstruction technique. COMPARISON:  Brain MRI 07/27/2022 FINDINGS: Brain: No intracranial hemorrhage, mass effect, or midline shift. No hydrocephalus. The basilar cisterns are patent. No evidence of territorial infarct or acute ischemia. No extra-axial or  intracranial fluid collection. Vascular: No hyperdense vessel or unexpected calcification. Skull: No fracture or focal lesion. Sinuses/Orbits: Chronic opacification of the left frontal sinus and left ethmoid air cells. No mastoid effusion. Other: No confluent scalp hematoma. IMPRESSION: No acute intracranial abnormality. No skull fracture. Electronically Signed   By: Andrea Gasman M.D.   On: 09/10/2023 02:31   DG Tibia/Fibula Left Result Date: 09/09/2023 CLINICAL DATA:  Lower leg deformity after fall.  Fall down stairs. EXAM: LEFT TIBIA AND FIBULA - 2 VIEW COMPARISON:  None Available. FINDINGS: Comminuted and displaced distal tibial metaphyseal fracture. No convincing extension to the ankle joint. Comminuted and displaced distal fibular fracture. No definite mortise widening. No ankle joint effusion. Soft tissue edema is seen at the fracture site. No fracture of the proximal tibia or fibula. Knee alignment is maintained. IMPRESSION: Comminuted and displaced distal tibial and fibular metaphyseal fractures. Electronically Signed   By: Andrea Gasman M.D.   On: 09/09/2023 23:02    Microbiology: Results for orders placed or  performed during the hospital encounter of 11/29/20  SARS CORONAVIRUS 2 (TAT 6-24 HRS) Nasopharyngeal Nasopharyngeal Swab     Status: None   Collection Time: 11/29/20  1:52 PM   Specimen: Nasopharyngeal Swab  Result Value Ref Range Status   SARS Coronavirus 2 NEGATIVE NEGATIVE Final    Comment: (NOTE) SARS-CoV-2 target nucleic acids are NOT DETECTED.  The SARS-CoV-2 RNA is generally detectable in upper and lower respiratory specimens during the acute phase of infection. Negative results do not preclude SARS-CoV-2 infection, do not rule out co-infections with other pathogens, and should not be used as the sole basis for treatment or other patient management decisions. Negative results must be combined with clinical observations, patient history, and epidemiological  information. The expected result is Negative.  Fact Sheet for Patients: hairslick.no  Fact Sheet for Healthcare Providers: quierodirigir.com  This test is not yet approved or cleared by the [place]  FDA and  has been authorized for detection and/or diagnosis of SARS-CoV-2 by FDA under an Emergency Use Authorization (EUA). This EUA will remain  in effect (meaning this test can be used) for the duration of the COVID-19 declaration under Se ction 564(b)(1) of the Act, 21 U.S.C. section 360bbb-3(b)(1), unless the authorization is terminated or revoked sooner.  Performed at [facility], [address] [place], [place] [address]     Labs: CBC: Recent Labs  Lab 09/09/23 2248 09/10/23 0146 09/11/23 0532  WBC 6.3 9.4 10.7*  NEUTROABS 3.7  --   --   HGB 13.1 12.9 12.3  HCT 38.2 36.9 35.6*  MCV 93.6 93.4 93.4  PLT 238 256 246   Basic Metabolic Panel: Recent Labs  Lab 09/09/23 2248 09/10/23 0146 09/11/23 0532  NA 134* 132* 135  K 3.5 3.3* 4.1  CL 101 100 103  CO2 20* 22 24  GLUCOSE 119* 130* 122*  BUN 17 18 9   CREATININE 0.64 0.67 0.62  CALCIUM 9.0 8.7* 8.9  MG  --   --  2.1  PHOS  --   --  2.8   Liver Function Tests: No results for input(s): AST, ALT, ALKPHOS, BILITOT, PROT, ALBUMIN in the last 168 hours. CBG: No results for input(s): GLUCAP in the last 168 hours.  Discharge time spent: greater than 30 minutes.  Signed: Murvin Mana, MD [facility] 09/11/2023

## 2023-09-11 NOTE — Progress Notes (Signed)
 DISCHARGE NOTE:    Pt discharged with pt education and instructions given. Pt voices no concerns or questions at this time. Pt surgical dressing clean, dry, and intact. Pt walker and 3 in 1 delivered at bedside. Pt wheeled down to medical mall entrance by staff, pt friend provided transportation.

## 2023-09-11 NOTE — Discharge Instructions (Addendum)
 Intensive Outpatient Programs   [facility] [address] [place],  [place], [place] [phone]  [facility] (Inpatient and outpatient)[phone] (Suboxone and Methadone) [address] [phone]  ADS: [facility] Programs - Intensive Outpatient [address] [place], [place] [address]  [phone]  [facility] (Outpatient, Inpatient, [facility] (Groups and Residental) (insurance only) [phone] [place], [place] [phone]   [facility] (for caregivers and family) [address] [place] [address] [place], [place] [phone]  Residential Treatment Programs  [facility](2 years) Residential: 37 days)[facility] [address] [place], [place], [place] [phone] or [phone]  [facility] [address] [place], [place], [place] [phone]  [facility] ([facility]) [address] [place], [place], [place] [phone] Admissions: 8am-3pm M-F  [facility] Program: Residential Program [phone] Days)             [facility]: [place] [facility]  [place], [place], [place]  [phone] or [phone] in Hours over the weekend or by referral)  [facility] [address] [place], [place] [address] [phone] (Do virtual or phone assessment, offer transportation within 25 miles, have in patient and Outpatient options)   Mobil Crisis: [facility]:[phone] (for crisis  response 24 hours a day)      Rent/Utility/Housing  Agency Name: [facility] Address: [address] [place], [place] [address] Phone: [phone] Email: [email or web address] Website: [email or web address] Service(s) Offered: Housing services, self-sufficiency, congregate meal program, weatherization program, field seismologist program, emergency food assistance,  housing counseling, home ownership program, wheels -towork program.  Agency Name: [facility] Address: [address] [place], [place] [address] Phone: [phone] (8a-4p) [phone] (8p- 10p) Email: [email or web address] Website: [email or web address] Service(s) Offered: A program for homeless and/or needy men that includes one-on-one counseling, life skills training and job rehabilitation.  Agency Name: [employer] [facility] [place] Address: [address], [place], [place] [address] Phone: [phone] Website: www.[email or web address] Service(s) Offered: Assistance to needy in emergency with utility bills, heating fuel, and prescriptions. Shelter for homeless 7pm-7am. January 04, 2017 15  Agency Name: [facility] [place] (Developmentally Disabled) Address: [address], [place], [place] [address] Phone: [phone] Contact Person: Lemond Cart Email: [email or web address] Website: [email or web address] Service(s) Offered: Helps individuals with developmental disabilities move from housing that is more restrictive to homes where they  can achieve greater independence and have more  opportunities.  Agency Name: [facility] Address: [address], [place], [place] [address] Phone: [phone] Email: [email or web address] Website: [email or web address].org Service(s) Offered: Provides affordable housing for low-income families, elderly, and disabled individuals. Offer a wide range of  programs and  services, from financial planning to afterschool and summer programs.  Agency Name: [facility] Address: [address] [place], [place] [address] Phone: [phone] Service(s) Offered: Child support services; child welfare services; food stamps; Medicaid; work first family assistance; and aid with fuel,  rent, food and medicine.  Agency Name: [facility] [place] [employer]. Address: [facility] [address] [place], [place]  [address] Phone: [phone] Website: www.familyabuseservices.org Service(s) Offered: 24 hour Crisis Line: [phone]; 24 hour Emergency Shelter; Transitional Housing; Support Groups; Scientist, Physiological; [employer]; Hispanic Outreach: [phone];  Visitation  Center: [phone].  Agency Name: [facility], [place]. Address: [address] [place], [place] [address] Phone: [phone] Service(s) Offered: CAP Services; Home and [employer]; Individual or Group Supports; Respite Care Non-Institutional Nursing;  Residential Supports; Respite Care and Personal Care Services; Transportation; Family and Friends Night; Recreational Activities; Three Nutritious Meals/Snacks; Consultation with Registered Dietician; Twenty-four hour Registered Nurse Access; Daily and [employer]; Camp [place] Leaves; [place] for the [employer] (During Summer Months) Bingo Night (Every  Wednesday Night); Special Populations Dance Night  (Every Tuesday Night); Professional Hair Care Services.  Agency Name: [facility] Address: [address], [place], [place] [address] Phone: [phone] Contact Person: Meade High Website: [email or web address].Physicist, Medical) Offered: Residential treatment facility for women; food and  clothing, educational & employment development and  transportation to work; counsellor of financial skills;  parenting and family reunification;  emotional and spiritual  support; transitional housing for program graduates.  Agency Name: [facility] Address: [address] [place], [place] [address] Phone: [phone] Email: [email or web address] Website: [email or web address] Service(s) Offered: Public housing units for elderly, disabled, and low income people; housing choice vouchers for income eligible  applicants; shelter plus care vouchers; and Psychologist, Clinical.  Agency Name: [employer] [facility] [employer] Address: [address] [place], [place] [address] Phone: [phone] Email: [email or web address]@[email or web address] Website: www.habitatalamance.org Service(s) Offered: Build houses for families in need of decent housing. Each adult in the family must invest 200 hours of labor on  someone else's house, work with volunteers to build their own house, attend classes on budgeting, home maintenance, yard care, and attend homeowner association meetings.  Agency Name: [facility] Address: [address] [place], [place] [address] Phone: [phone] Website: www.rsli.org Service(s) Offered: Intermediate care facilities for intellectually delayed, Supervised Living in group homes for adults with developmental disabilities, Supervised Living for people who have dual diagnoses (MRMI), Independent Living, Supported Living, respite and a variety of CAP services, pre-vocational services, day supports, and [employer].  Agency Name: [place] [employer] Phone: [phone] Website: www.NCForeclosurePrevention.gov Service(s) Offered: Zero-interest, deferred loans to homeowners struggling to pay their mortgage. Call for more information.   You have not been set up with Home Health Services as we were unable to locate a Home Health Agency with availability In Network with your Insurance If needing Physical therapy or Occupational Therapy you may go to the outpatient therapy clinic of  your choice, if needing a referral your Orthopedic physician or PCP can send the referral to the Therapy clinic of choice.

## 2023-09-11 NOTE — Progress Notes (Signed)
 Orthopedic progress note  Chief complaint, left leg fractures, left ankle fracture  Subjective, no acute events overnight  Objective, chart reviewed  The dressing is clean and dry.  She can wiggle her toes.  Sensation is intact to light touch.  She is comfortable on the exam.  Her pain is less than yesterday.  No concerns on exam today   37 year old female postoperative day #1 after surgical stabilization of left tib-fib fractures and left ankle fracture.  Overall she is doing well.   Plan for today is to physical therapy.  The patient needs to clear physical therapy for safe discharge home.  She her patient reports that she has stairs at home and that could slow down her discharge from the hospital but we will see how she does with the therapy team today  She is toe-touch weightbearing of the left lower extremity  Upon discharge please send the patient out on aspirin  81 mg twice daily for 6 weeks She will need outpatient follow-up appointment with the orthopedic office  If she is discharged from the hospital today on December 31 then the bandaging can stay on otherwise prior to discharge plan for a full dressing change.  Her knee immobilizer was removed today.  She does not need that anymore it is for convenience only.  Thank you.  I am off service today.  Orthopedics to continue to follow.     Redell Kerbs, MD, [facility]

## 2024-01-10 ENCOUNTER — Telehealth: Payer: Self-pay | Admitting: Adult Health

## 2024-01-10 NOTE — Telephone Encounter (Signed)
 CNM received  from [employer] for CPAP supplies

## 2024-01-22 ENCOUNTER — Ambulatory Visit: Admitting: Sleep Medicine

## 2024-01-22 ENCOUNTER — Telehealth: Payer: Self-pay | Admitting: Sleep Medicine

## 2024-01-22 ENCOUNTER — Encounter: Payer: Self-pay | Admitting: Sleep Medicine

## 2024-01-22 VITALS — BP 118/80 | HR 77 | Temp 98.3°F | Ht 69.0 in | Wt 273.0 lb

## 2024-01-22 DIAGNOSIS — G4733 Obstructive sleep apnea (adult) (pediatric): Secondary | ICD-10-CM

## 2024-01-22 DIAGNOSIS — Z6841 Body Mass Index (BMI) 40.0 and over, adult: Secondary | ICD-10-CM

## 2024-01-22 NOTE — Progress Notes (Unsigned)
 Name:[name] MRN: [id number] DOB: 1985/09/24   CHIEF COMPLAINT:  EXCESSIVE DAYTIME SLEEPINESS   HISTORY OF PRESENT ILLNESS:  [name] is a 38 y.o. w/ a h/o OSA, who present for c/o loud snoring and excessive daytime sleepiness which has been present for several years. Reports nocturnal awakenings due to unclear reasons, however does not have difficulty falling back to sleep. Denies any significant weight changes. Denies morning headaches, RLS symptoms, dream enactment, cataplexy, hypnagogic or hypnapompic hallucinations. Reports a family history of sleep apnea. Denies drowsy driving. Drinks 1-2 sodas daily, occasional alcohol use, former smoker, denies illicit drug use.   Bedtime 10:30-11 pm Sleep onset 30 mins Rise time 6:30-7 am   EPWORTH SLEEP SCORE    05/15/2023   11:00 AM  Results of the Epworth flowsheet  Sitting and reading 0  Watching TV 0  Sitting, inactive in a public place (e.g. a theatre or a meeting) 0  As a passenger in a car for an hour without a break 0  Lying down to rest in the afternoon when circumstances permit 0  Sitting and talking to someone 0  Sitting quietly after a lunch without alcohol 0  In a car, while stopped for a few minutes in traffic 0  Total score 0     PAST MEDICAL HISTORY :   has a past medical history of Alcoholic cirrhosis of liver (HCC), Allergy, Anxiety, COVID-19 (01/2021), Depression, Ectopic pregnancy, Frequent headaches, GERD (gastroesophageal reflux disease), Hypertension, and Seizures (HCC).  has a past surgical history that includes Colposcopy; Cholecystectomy (2015); Diagnostic laparoscopy with removal of ectopic pregnancy (Right, 02/13/2017); ORIF humerus fracture (Right, 12/02/2020); Elbow surgery (Right, 11/2020); and Tibia IM nail insertion (Left, 09/10/2023). Prior to Admission medications   Medication Sig Start Date End Date Taking? Authorizing Provider  levocetirizine (XYZAL) 5 MG tablet Take 5 mg by mouth  daily. 10/09/23  Yes [provider]  norethindrone (SHAROBEL) 0.35 MG tablet Take 1 tablet by mouth daily.   Yes [provider]  omeprazole (PRILOSEC) 40 MG capsule Take 40 mg by mouth daily.   Yes [provider]  Prenatal Vit-Fe Fumarate-FA (PRENATAL VITAMIN PO) Take by mouth daily.   Yes [provider]  Semaglutide-Weight Management 0.25 MG/0.5ML SOAJ Inject 0.25 mg into the skin. 01/17/24  Yes [provider]  UBRELVY  100 MG TABS Take 100 mg tablet PO as a single dose. A second dose may be taken at least 2 hours after the initial dose if needed. For max dose of 200mg  in 24 hours. 04/25/22  Yes Karamalegos, Kayleen Party, DO  oxyCODONE -acetaminophen  (PERCOCET/ROXICET) 5-325 MG tablet Take 1 tablet by mouth every 4 (four) hours as needed for moderate pain (pain score 4-6). Patient not taking: Reported on 01/22/2024 09/11/23   Donaciano Frizzle, MD  venlafaxine  XR (EFFEXOR -XR) 37.5 MG 24 hr capsule Take 37.5 mg by mouth daily with breakfast. 01/03/23 01/03/24  [provider]   Allergies  Allergen Reactions   Bupropion Other (See Comments)    Causes seizures.  Seizure  Wellbutrin  Seizure    Wellbutrin    FAMILY HISTORY:  family history includes Alcohol abuse in her father and mother. SOCIAL HISTORY:  reports that she has never smoked. She has never used smokeless tobacco. She reports current alcohol use of about 4.0 standard drinks of alcohol per week. She reports that she does not use drugs.   Review of Systems:  Gen:  Denies  fever, sweats, chills weight  loss  HEENT: Denies blurred vision, double vision, ear pain, eye pain, hearing loss, nose bleeds, sore throat Cardiac:  No dizziness, chest pain or heaviness, chest tightness,edema, No JVD Resp:   No cough, -sputum production, -shortness of breath,-wheezing, -hemoptysis,  Gi: Denies swallowing difficulty, stomach pain, nausea or vomiting, diarrhea, constipation, bowel  incontinence Gu:  Denies bladder incontinence, burning urine Ext:   Denies Joint pain, stiffness or swelling Skin: Denies  skin rash, easy bruising or bleeding or hives Endoc:  Denies polyuria, polydipsia , polyphagia or weight change Psych:   Denies depression, insomnia or hallucinations  Other:  All other systems negative  VITAL SIGNS: BP 118/80 (BP Location: Right Arm, Patient Position: Sitting, Cuff Size: Large)   Pulse 77   Temp 98.3 F (36.8 C) (Oral)   Ht 5\' 9"  (1.753 m)   Wt 273 lb (123.8 kg)   SpO2 97%   BMI 40.32 kg/m    Physical Examination:   General Appearance: No distress  EYES PERRLA, EOM intact.   NECK Supple, No JVD Pulmonary: normal breath sounds, No wheezing.  CardiovascularNormal S1,S2.  No m/r/g.   Abdomen: Benign, Soft, non-tender. Skin:   warm, no rashes, no ecchymosis  Extremities: normal, no cyanosis, clubbing. Neuro:without focal findings,  speech normal  PSYCHIATRIC: Mood, affect within normal limits.   ASSESSMENT AND PLAN  OSA I suspect that OSA is likely present due to clinical presentation. Discussed the consequences of untreated sleep apnea. Advised not to drive drowsy for safety of patient and others. Will complete further evaluation with a home sleep study and follow up to review results.    HTN Stable, on current management. Following with PCP.    MEDICATION ADJUSTMENTS/LABS AND TESTS ORDERED: Recommend Sleep Study   Patient  satisfied with Plan of action and management. All questions answered  Follow up to review HST results and treatment plan.   I spent a total of  *** minutes reviewing chart data, face-to-face evaluation with the patient, counseling and coordination of care as detailed above.    Keller Bounds, M.D.  Sleep Medicine [facility]

## 2024-01-22 NOTE — Patient Instructions (Signed)

## 2024-01-22 NOTE — Telephone Encounter (Signed)
 Dr. Kieran Pellet is asking if she can come earlier around the 330 pm mark

## 2024-03-09 NOTE — Discharge Instructions (Signed)
  Instructions for Ankle Fracture   [name] P. Amira Podolak, Jr., M.D.    Dept. of Orthopaedics & Sports Medicine [facility] [address] [place], [place]  [address]  Phone: [phone]   Fax: [phone]       [email or web address]       ACTIVITY:  You may use crutches or a walker with weight-bearing as tolerated to the affected leg  WOUND CARE:  Place one to two pillows under the left foot and ankle when sitting or lying.  Continue to use the ice packs or the Polar Care to reduce pain and swelling. Keep the dressing clean and dry.  MEDICATIONS: You may resume your regular medications. Please take the pain medication as prescribed. Do not take pain medication on an empty stomach. Do not drive or drink alcoholic beverages when taking pain medications.  CALL THE OFFICE FOR: Temperature above 101 degrees Excessive bleeding or drainage on the dressing. Excessive swelling, coldness, or paleness of the toes. Persistent nausea and vomiting.  FOLLOW-UP:  You should have an appointment to return to the office in 7-10 days.

## 2024-03-10 NOTE — H&P (Signed)
 ORTHOPAEDIC HISTORY & PHYSICAL [name], Lynwood Idalia Raddle., MD - 02/21/2024 10:00 AM EDT Formatting of this note is different from the original. Images from the original note were not included. Chief Complaint: Chief Complaint Patient presents with New Patient Lt fibula and ankle; Discuss surgery    Reason for Visit: The patient is a 38 y.o. female who presents today for reevaluation of her left ankle and tibia. She is 6 months status post intramedullary nailing of a left tibial shaft fracture and intramedullary fixation of a left lateral malleolar ankle fracture performed by Dr. Zafonte. She is not using any ambulatory aids. She has been weightbearing as tolerated.She completed a course of physical therapy and has appreciated improvement of her ankle range of motion.  She does complain of catching and popping along the lateral aspect of the ankle. She does have some associated pain to the site. She denies any issues with the skin and reports that the swelling has almost completely resolved.  Medications: Current Outpatient Medications Medication Sig Dispense Refill levocetirizine (XYZAL) 5 MG tablet Take 5 mg by mouth once daily multivitamin tablet Take 1 tablet by mouth once daily norethindrone (MICRONOR) 0.35 mg tablet Take 1 tablet by mouth once daily omeprazole (PRILOSEC) 40 MG DR capsule Take 1 capsule (40 mg total) by mouth once daily 90 capsule 1 semaglutide (WEGOVY) 0.25 mg/0.5 mL pen injector Inject 0.25 mg subcutaneously once a week semaglutide (WEGOVY) 0.5 mg/0.5 mL pen injector Inject 0.5 mLs (0.5 mg total) subcutaneously once a week 2 mL 0 ubrogepant  (UBRELVY ) 100 mg Tab TAKE 1 TABLET BY MOUTH AT ONSET OF MIGRAINE. MAY REPEAT AFTER 2 HOURS AS NEEDED 16 tablet 5 venlafaxine  (EFFEXOR ) 25 MG tablet Take 1 tablet (25 mg total) by mouth 2 (two) times daily 60 tablet 3  No current facility-administered medications for this visit.  Allergies: Allergies Allergen  Reactions Bupropion Other (See Comments) and Unknown Causes seizures. Seizure  Other reaction(s): OTHER  Seizure Causes seizures. Seizure  Causes seizures.  Seizure  Wellbutrin  Seizure Wellbutrin Bupropion Hcl Other (See Comments) Seizure  Wellbutrin  Past Medical History: Past Medical History: Diagnosis Date Abnormal Pap smear 2010 colposcopy negative Alcoholic cirrhosis of liver (CMS/HHS-HCC) 12/2011 Dr Viktoria Allergic state pollen, on immunotherapy Anxiety No current psychiatrist, prior wellbutrin, risperdol Gallstones GERD (gastroesophageal reflux disease) 2008 PPI effective Hx of endoscopy 10-04-2012 Dr. Viktoria Hypertension 2011 Improved with weight loss Seizures (CMS/HHS-HCC) one episode 2012- medication interaction  Past Surgical History: Past Surgical History: Procedure Laterality Date CHOLECYSTECTOMY 2015  Social History: Social History  Socioeconomic History Marital status: Divorced Spouse name: NA Number of children: 1 Years of education: 12 Highest education level: Bachelor's degree (e.g., BA, AB, BS) Occupational History Occupation: Accountant Tobacco Use Smoking status: Never Smokeless tobacco: Never Tobacco comments: NA Vaping Use Vaping status: Never Used Substance and Sexual Activity Alcohol use: Not Currently Comment: Prior abuse- starting 2010 Drug use: No Comment: prior use as a teenager- acid, cocaine Sexual activity: Yes Partners: Male Birth control/protection: None Comment: counseled to use condoms Other Topics Concern Would you please tell us  about the people who live in your home, your pets, or anything else important to your social life? No  Social Drivers of Catering manager Strain: Low Risk (02/21/2024) Overall Financial Resource Strain (CARDIA) Difficulty of Paying Living Expenses: Not hard at all Food Insecurity: No Food Insecurity (02/21/2024) Hunger Vital Sign Worried About Running Out of  Food in the Last Year: Never true Ran Out of Food in the  Last Year: Never true Transportation Needs: No Transportation Needs (02/21/2024) PRAPARE - Contractor (Medical): No Lack of Transportation (Non-Medical): No Physical Activity: Sufficiently Active (03/02/2022) Exercise Vital Sign Days of Exercise per Week: 4 days Minutes of Exercise per Session: 60 min Stress: Stress Concern Present (03/02/2022) [employer] of Occupational Health - Occupational Stress Questionnaire Feeling of Stress : Very much Social Connections: Moderately Integrated (03/02/2022) Social Connection and Isolation Panel Frequency of Communication with Friends and Family: Three times a week Frequency of Social Gatherings with Friends and Family: Once a week Attends Religious Services: More than 4 times per year Active Member of [employer] or Organizations: Yes Attends Engineer, structural: More than 4 times per year Marital Status: Separated Housing Stability: Low Risk (02/21/2024) Housing Stability Vital Sign Unable to Pay for Housing in the Last Year: No Number of Times Moved in the Last Year: 0 Homeless in the Last Year: No  Family History: Family History Problem Relation Name Age of Onset Anxiety Mother Depression Mother Osteoarthritis Mother Hip fracture Mother Alcohol abuse Mother High blood pressure (Hypertension) Father Obesity Father Depression Father Anxiety Father Coronary Artery Disease (Blocked arteries around heart) Paternal Grandfather High blood pressure (Hypertension) Paternal Grandfather Colon cancer Neg Hx Colon polyps Neg Hx Breast cancer Neg Hx Skin cancer Neg Hx  Review of Systems: A comprehensive 14 point ROS was performed, reviewed, and the pertinent orthopaedic findings are documented in the HPI.  Exam BP 122/70  Ht 175.3 cm (5' 9)  Wt (!) 120.1 kg (264 lb 12.8 oz)  BMI 39.10 kg/m  General: Well-developed, well-nourished female seen  in no acute distress. Even heel to toe gait.  HEENT: Atraumatic, normocephalic. Pupils are equal and reactive to light. Extraocular motion is intact. Sclera are clear. Oropharynx is clear with moist mucosa.  Neck: Supple, nontender, and with good ROM. No thyromegaly, adenopathy, JVD, or carotid bruits.  Lungs: Clear to auscultation bilaterally.  Cardiovascular: Regular rate and rhythm. Normal S1, S2. No murmur . No appreciable gallops or rubs. Peripheral pulses are palpable. No lower extremity edema. Homan`s test is negative.  Left Ankle: Soft tissue swelling: minimal Effusion: none Erythema: none Crepitance: none Tenderness: Tender distal to the lateral malleolus Ankle drawer test: negative Incision: Healing well without evidence of infection. Range of Motion: Good ankle range of motion  Vascular: Peripheral pulses are palpable. Good capillary refill. No gross pretibial or ankle edema. Homans test is negative.  Neurologic: Awake, alert, and oriented. Sensory function is intact to pinprick and light touch with the exception of some numbness to the left 1st and 2nd toes. Motor strength is judged to be 5/5. Motor coordination is within normal limits. No apparent clonus. No tremor.  Radiographs: I ordered and interpreted AP, lateral, and mortise views of the left ankle that were obtained in the office today. An intramedullary tibial nail with distal locking screws are visualized. The distal tibia fracture is in good alignment with some bony consolidation appreciated. An intramedullary K wire is in place to the distal fibula. The distal aspect of the K wire is prominent and appears to have migrated. The fibular fracture has consolidated. There is good preservation of the ankle mortise.  I ordered and interpreted AP and lateral lateral views of the left tibia/fibula that were obtained in the office today. A tibial nail is in place with locking screws proximally and distally. The  previous fracture site is consolidating. An intramedullary K wire is present in the distal fibula. The wire  is prominent distally. The distal fibula fracture has consolidated.  Impression: ORIF of a left lateral malleolar ankle fracture Intramedullary nailing of left tibial shaft fracture  Plan: Operative notes were reviewed. Notes from physical therapy were reviewed. The findings were discussed in detail with the patient. The prominent K-wire may be causing some irritation to the soft tissues along the lateral aspect of the ankle. We discussed the risks and benefits of removal of the K-wire. The usual postoperative course was discuss. The patient expressed her understanding of the Arthroscopy is an appropriate treatment for the meniscal pathology, but would have limited or no effect on degenerative changes of the articular cartilage. And benefit s of surgical intervention and agreed with plans for removal of the K-wire from the lateral fibula.  I spent a total of 40 minutes in both face-to-face and non-face-to-face activities, excluding procedures performed, for this visit on the date of this encounter.  SPECIAL EQUIPMENT: None. ACTIVITIES: As tolerated. WORK STATUS: As tolerated. THERAPY: Home exercise program as discussed with the patient. MEDICATIONS: Requested Prescriptions  No prescriptions requested or ordered in this encounter  FOLLOW-UP: Return for postoperative follow-up.  Korbyn Chopin P. Monya Kozakiewicz, Jr., M.D.  This note was generated in part with voice recognition software and I apologize for any typographical errors that were not detected and corrected. Answers submitted by the patient for this visit:

## 2024-03-11 ENCOUNTER — Other Ambulatory Visit: Payer: Self-pay

## 2024-03-11 ENCOUNTER — Encounter
Admission: RE | Admit: 2024-03-11 | Discharge: 2024-03-11 | Disposition: A | Discharge status: Home / Self Care | Source: Ambulatory Visit | Attending: Orthopedic Surgery | Admitting: Orthopedic Surgery

## 2024-03-11 VITALS — Ht 69.0 in | Wt 260.0 lb

## 2024-03-11 DIAGNOSIS — Z01812 Encounter for preprocedural laboratory examination: Secondary | ICD-10-CM

## 2024-03-11 NOTE — Patient Instructions (Addendum)
 Your procedure is scheduled on: Monday 03/17/24  Report to the Registration Desk on the 1st floor of the [facility]. To find out your arrival time, please call [phone] between 1PM - 3PM on: Thursday 03/13/24 If your arrival time is 6:00 am, do not arrive before that time as the [facility] entrance doors do not open until 6:00 am.  REMEMBER: Instructions that are not followed completely may result in serious medical risk, up to and including death; or upon the discretion of your surgeon and anesthesiologist your surgery may need to be rescheduled.  Do not eat food after midnight the night before surgery.  No gum chewing or hard candies.  You may however, drink CLEAR liquids up to 2 hours before you are scheduled to arrive for your surgery. Do not drink anything within 2 hours of your scheduled arrival time.  Clear liquids include: - water  - apple juice without pulp - gatorade (not RED colors) - black coffee or tea (Do NOT add milk or creamers to the coffee or tea) Do NOT drink anything that is not on this list.   In addition, your doctor has ordered for you to drink the provided:  Pre Surgery Drink (Ensure)  Drinking this carbohydrate drink up to two hours before surgery helps to reduce insulin resistance and improve patient outcomes. Please complete drinking 2 hours before scheduled arrival time.  One week prior to surgery: Stop Anti-inflammatories (NSAIDS) such as Advil , Aleve, Ibuprofen , Motrin , Naproxen, Naprosyn and Aspirin  based products such as Excedrin, Goody's Powder, BC Powder. Stop ANY OVER THE COUNTER supplements until after surgery.  You may however, continue to take Tylenol  if needed for pain up until the day of surgery.  Stop WEGOVY 1 MG/0.5ML SOAJ 7 days prior to surgery.  Continue taking all of your other prescription medications up until the day of surgery.  ON THE DAY OF SURGERY ONLY TAKE THESE MEDICATIONS WITH SIPS OF WATER:  omeprazole (PRILOSEC) 40 MG     No Alcohol for 24 hours before or after surgery.  No Smoking including e-cigarettes for 24 hours before surgery.  No chewable tobacco products for at least 6 hours before surgery.  No nicotine patches on the day of surgery.  Do not use any recreational drugs for at least a week (preferably 2 weeks) before your surgery.  Please be advised that the combination of cocaine and anesthesia may have negative outcomes, up to and including death. If you test positive for cocaine, your surgery will be cancelled.  On the morning of surgery brush your teeth with toothpaste and water, you may rinse your mouth with mouthwash if you wish.  Do not swallow any toothpaste or mouthwash.  Use CHG Soap or wipes as directed on instruction sheet.  Do not wear jewelry, make-up, hairpins, clips or nail polish.  For welded (permanent) jewelry: bracelets, anklets, waist bands, etc.  Please have this removed prior to surgery.  If it is not removed, there is a chance that hospital personnel will need to cut it off on the day of surgery.  Do not wear lotions, powders, or perfumes.   Do not shave body hair from the neck down 48 hours before surgery.  Contact lenses, hearing aids and dentures may not be worn into surgery.  Do not bring valuables to the hospital. [facility] is not responsible for any missing/lost belongings or valuables.   Notify your doctor if there is any change in your medical condition (cold, fever, infection).  Wear comfortable clothing (specific to your surgery type) to the hospital.  After surgery, you can help prevent lung complications by doing breathing exercises.  Take deep breaths and cough every 1-2 hours. Your doctor may order a device called an Incentive Spirometer to help you take deep breaths. When coughing or sneezing, hold a pillow firmly against your incision with both hands. This is called "splinting." Doing this helps protect your incision. It also decreases belly  discomfort.  If you are being admitted to the hospital overnight, leave your suitcase in the car. After surgery it may be brought to your room.  In case of increased patient census, it may be necessary for you, the patient, to continue your postoperative care in the Same Day Surgery department.  If you are being discharged the day of surgery, you will not be allowed to drive home. You will need a responsible individual to drive you home and stay with you for 24 hours after surgery.   If you are taking public transportation, you will need to have a responsible individual with you.  Please call the Pre-admissions Testing Dept. at [phone] if you have any questions about these instructions.  Surgery Visitation Policy:  Patients having surgery or a procedure may have two visitors.  Children under the age of 89 must have an adult with them who is not the patient.  Inpatient Visitation:    Visiting hours are 7 a.m. to 8 p.m. Up to four visitors are allowed at one time in a patient room. The visitors may rotate out with other people during the day.  One visitor age 26 or older may stay with the patient overnight and must be in the room by 8 p.m.  Merchandiser, retail to address health-related social needs:  [email or web address]     Preparing for Surgery with CHLORHEXIDINE GLUCONATE (CHG) Soap  Chlorhexidine Gluconate (CHG) Soap  o An antiseptic cleaner that kills germs and bonds with the skin to continue killing germs even after washing  o Used for showering the night before surgery and morning of surgery  Before surgery, you can play an important role by reducing the number of germs on your skin.  CHG (Chlorhexidine gluconate) soap is an antiseptic cleanser which kills germs and bonds with the skin to continue killing germs even after washing.  Please do not use if you have an allergy to CHG or antibacterial soaps. If your skin becomes reddened/irritated  stop using the CHG.  1. Shower the NIGHT BEFORE SURGERY and the MORNING OF SURGERY with CHG soap.  2. If you choose to wash your hair, wash your hair first as usual with your normal shampoo.  3. After shampooing, rinse your hair and body thoroughly to remove the shampoo.  4. Use CHG as you would any other liquid soap. You can apply CHG directly to the skin and wash gently with a scrungie or a clean washcloth.  5. Apply the CHG soap to your body only from the neck down. Do not use on open wounds or open sores. Avoid contact with your eyes, ears, mouth, and genitals (private parts). Wash face and genitals (private parts) with your normal soap.  6. Wash thoroughly, paying special attention to the area where your surgery will be performed.  7. Thoroughly rinse your body with warm water.  8. Do not shower/wash with your normal soap after using and rinsing off the CHG soap.  9. Pat yourself dry with a clean towel.  10. Wear clean  pajamas to bed the night before surgery.  12. Place clean sheets on your bed the night of your first shower and do not sleep with pets.  13. Shower again with the CHG soap on the day of surgery prior to arriving at the hospital.  14. Do not apply any deodorants/lotions/powders.  15. Please wear clean clothes to the hospital.

## 2024-03-16 ENCOUNTER — Encounter: Payer: Self-pay | Admitting: Orthopedic Surgery

## 2024-03-17 ENCOUNTER — Ambulatory Visit: Admitting: Certified Registered"

## 2024-03-17 ENCOUNTER — Ambulatory Visit

## 2024-03-17 ENCOUNTER — Encounter
Admission: RE | Disposition: A | Discharge status: Home / Self Care | Payer: Self-pay | Source: Ambulatory Visit | Attending: Orthopedic Surgery

## 2024-03-17 ENCOUNTER — Ambulatory Visit
Admission: RE | Admit: 2024-03-17 | Discharge: 2024-03-17 | Disposition: A | Discharge status: Home / Self Care | Source: Ambulatory Visit | Attending: Orthopedic Surgery | Admitting: Orthopedic Surgery

## 2024-03-17 ENCOUNTER — Other Ambulatory Visit: Payer: Self-pay

## 2024-03-17 DIAGNOSIS — K219 Gastro-esophageal reflux disease without esophagitis: Secondary | ICD-10-CM | POA: Insufficient documentation

## 2024-03-17 DIAGNOSIS — Z8249 Family history of ischemic heart disease and other diseases of the circulatory system: Secondary | ICD-10-CM | POA: Diagnosis not present

## 2024-03-17 DIAGNOSIS — T8484XA Pain due to internal orthopedic prosthetic devices, implants and grafts, initial encounter: Secondary | ICD-10-CM | POA: Diagnosis present

## 2024-03-17 DIAGNOSIS — I10 Essential (primary) hypertension: Secondary | ICD-10-CM | POA: Diagnosis not present

## 2024-03-17 DIAGNOSIS — Y798 Miscellaneous orthopedic devices associated with adverse incidents, not elsewhere classified: Secondary | ICD-10-CM | POA: Insufficient documentation

## 2024-03-17 DIAGNOSIS — Z79899 Other long term (current) drug therapy: Secondary | ICD-10-CM | POA: Diagnosis not present

## 2024-03-17 DIAGNOSIS — F418 Other specified anxiety disorders: Secondary | ICD-10-CM | POA: Insufficient documentation

## 2024-03-17 DIAGNOSIS — Z01812 Encounter for preprocedural laboratory examination: Secondary | ICD-10-CM

## 2024-03-17 DIAGNOSIS — Z9889 Other specified postprocedural states: Secondary | ICD-10-CM

## 2024-03-17 HISTORY — PX: HARDWARE REMOVAL: SHX979

## 2024-03-17 LAB — POCT PREGNANCY, URINE: Preg Test, Ur: NEGATIVE

## 2024-03-17 SURGERY — REMOVAL, HARDWARE
Anesthesia: General | Site: Ankle | Laterality: Left

## 2024-03-17 MED ORDER — ACETAMINOPHEN 10 MG/ML IV SOLN
1000.0000 mg | Freq: Once | INTRAVENOUS | Status: DC | PRN
  Administered 2024-03-17: 1000 mg via INTRAVENOUS

## 2024-03-17 MED ORDER — 0.9 % SODIUM CHLORIDE (POUR BTL) OPTIME
TOPICAL | Status: DC | PRN
  Administered 2024-03-17: 500 mL

## 2024-03-17 MED ORDER — CHLORHEXIDINE GLUCONATE 0.12 % MT SOLN
15.0000 mL | Freq: Once | OROMUCOSAL | Status: AC
  Administered 2024-03-17: 15 mL via OROMUCOSAL

## 2024-03-17 MED ORDER — BUPIVACAINE HCL (PF) 0.25 % IJ SOLN
INTRAMUSCULAR | Status: DC | PRN
  Administered 2024-03-17: 3 mL

## 2024-03-17 MED ORDER — OXYCODONE HCL 5 MG PO TABS
5.0000 mg | ORAL_TABLET | Freq: Once | ORAL | Status: AC | PRN
  Administered 2024-03-17: 5 mg via ORAL

## 2024-03-17 MED ORDER — LIDOCAINE HCL (PF) 2 % IJ SOLN
INTRAMUSCULAR | Status: AC
  Filled 2024-03-17: qty 5

## 2024-03-17 MED ORDER — PROPOFOL 10 MG/ML IV BOLUS
INTRAVENOUS | Status: DC | PRN
  Administered 2024-03-17: 200 mg via INTRAVENOUS

## 2024-03-17 MED ORDER — FENTANYL CITRATE (PF) 100 MCG/2ML IJ SOLN
INTRAMUSCULAR | Status: AC
  Filled 2024-03-17: qty 2

## 2024-03-17 MED ORDER — DROPERIDOL 2.5 MG/ML IJ SOLN: 0.6250 mg | Freq: Once | INTRAMUSCULAR | Status: DC | PRN

## 2024-03-17 MED ORDER — PROPOFOL 10 MG/ML IV BOLUS
INTRAVENOUS | Status: AC
  Filled 2024-03-17: qty 20

## 2024-03-17 MED ORDER — MIDAZOLAM HCL 2 MG/2ML IJ SOLN
INTRAMUSCULAR | Status: DC | PRN
  Administered 2024-03-17: 2 mg via INTRAVENOUS

## 2024-03-17 MED ORDER — LIDOCAINE HCL (CARDIAC) PF 100 MG/5ML IV SOSY
PREFILLED_SYRINGE | INTRAVENOUS | Status: DC | PRN
  Administered 2024-03-17: 100 mg via INTRAVENOUS

## 2024-03-17 MED ORDER — ORAL CARE MOUTH RINSE: 15.0000 mL | Freq: Once | OROMUCOSAL | Status: AC

## 2024-03-17 MED ORDER — CEFAZOLIN SODIUM-DEXTROSE 2-4 GM/100ML-% IV SOLN
INTRAVENOUS | Status: AC
  Filled 2024-03-17: qty 100

## 2024-03-17 MED ORDER — ENSURE PRE-SURGERY PO LIQD
296.0000 mL | Freq: Once | ORAL | Status: AC
  Administered 2024-03-17: 296 mL via ORAL

## 2024-03-17 MED ORDER — LACTATED RINGERS IV SOLN: INTRAVENOUS | Status: DC

## 2024-03-17 MED ORDER — DEXMEDETOMIDINE HCL IN NACL 80 MCG/20ML IV SOLN
INTRAVENOUS | Status: DC | PRN
  Administered 2024-03-17: 4 ug via INTRAVENOUS

## 2024-03-17 MED ORDER — ONDANSETRON HCL 4 MG/2ML IJ SOLN
INTRAMUSCULAR | Status: DC | PRN
  Administered 2024-03-17: 4 mg via INTRAVENOUS

## 2024-03-17 MED ORDER — FENTANYL CITRATE (PF) 100 MCG/2ML IJ SOLN: 25.0000 ug | INTRAMUSCULAR | Status: DC | PRN

## 2024-03-17 MED ORDER — CHLORHEXIDINE GLUCONATE 0.12 % MT SOLN
OROMUCOSAL | Status: AC
  Filled 2024-03-17: qty 15

## 2024-03-17 MED ORDER — CHLORHEXIDINE GLUCONATE 4 % EX SOLN: 60.0000 mL | Freq: Once | CUTANEOUS | Status: DC

## 2024-03-17 MED ORDER — ONDANSETRON HCL 4 MG/2ML IJ SOLN
INTRAMUSCULAR | Status: AC
  Filled 2024-03-17: qty 2

## 2024-03-17 MED ORDER — ACETAMINOPHEN 10 MG/ML IV SOLN
INTRAVENOUS | Status: AC
  Filled 2024-03-17: qty 100

## 2024-03-17 MED ORDER — CELECOXIB 200 MG PO CAPS
400.0000 mg | ORAL_CAPSULE | Freq: Once | ORAL | Status: AC
  Administered 2024-03-17: 400 mg via ORAL

## 2024-03-17 MED ORDER — OXYCODONE HCL 5 MG/5ML PO SOLN: 5.0000 mg | Freq: Once | ORAL | Status: AC | PRN

## 2024-03-17 MED ORDER — DEXAMETHASONE SODIUM PHOSPHATE 10 MG/ML IJ SOLN
INTRAMUSCULAR | Status: AC
  Filled 2024-03-17: qty 1

## 2024-03-17 MED ORDER — FENTANYL CITRATE (PF) 100 MCG/2ML IJ SOLN
INTRAMUSCULAR | Status: DC | PRN
  Administered 2024-03-17: 100 ug via INTRAVENOUS

## 2024-03-17 MED ORDER — MIDAZOLAM HCL 2 MG/2ML IJ SOLN
INTRAMUSCULAR | Status: AC
  Filled 2024-03-17: qty 2

## 2024-03-17 MED ORDER — CEFAZOLIN SODIUM-DEXTROSE 2-4 GM/100ML-% IV SOLN
2.0000 g | INTRAVENOUS | Status: AC
  Administered 2024-03-17: 2 g via INTRAVENOUS

## 2024-03-17 MED ORDER — CELECOXIB 200 MG PO CAPS
ORAL_CAPSULE | ORAL | Status: AC
  Filled 2024-03-17: qty 2

## 2024-03-17 MED ORDER — BUPIVACAINE HCL (PF) 0.25 % IJ SOLN
INTRAMUSCULAR | Status: AC
  Filled 2024-03-17: qty 30

## 2024-03-17 MED ORDER — OXYCODONE HCL 5 MG PO TABS
ORAL_TABLET | ORAL | Status: AC
  Filled 2024-03-17: qty 1

## 2024-03-17 MED ORDER — HYDROCODONE-ACETAMINOPHEN 5-325 MG PO TABS: 1.0000 | ORAL_TABLET | Freq: Four times a day (QID) | ORAL | 0 refills | Status: DC | PRN

## 2024-03-17 MED ORDER — DEXAMETHASONE SODIUM PHOSPHATE 10 MG/ML IJ SOLN
INTRAMUSCULAR | Status: DC | PRN
  Administered 2024-03-17: 10 mg via INTRAVENOUS

## 2024-03-17 SURGICAL SUPPLY — 31 items
BNDG COHESIVE 4X5 TAN STRL LF (GAUZE/BANDAGES/DRESSINGS) ×1 IMPLANT
BNDG ELASTIC 4INX 5YD STR LF (GAUZE/BANDAGES/DRESSINGS) ×1 IMPLANT
BNDG ESMARCH 6X12 STRL LF (GAUZE/BANDAGES/DRESSINGS) ×1 IMPLANT
CUFF TRNQT CYL 24X4X16.5-23 (TOURNIQUET CUFF) IMPLANT
CUFF TRNQT CYL 34X4.125X (TOURNIQUET CUFF) IMPLANT
DRAPE FLUOR MINI C-ARM 54X84 (DRAPES) ×1 IMPLANT
DRAPE U-SHAPE 47X51 STRL (DRAPES) ×1 IMPLANT
DRSG OPSITE POSTOP 3X4 (GAUZE/BANDAGES/DRESSINGS) IMPLANT
DURAPREP 26ML APPLICATOR (WOUND CARE) ×1 IMPLANT
ELECT CAUTERY BLADE 6.4 (BLADE) ×1 IMPLANT
ELECTRODE REM PT RTRN 9FT ADLT (ELECTROSURGICAL) ×1 IMPLANT
GAUZE SPONGE 4X4 12PLY STRL (GAUZE/BANDAGES/DRESSINGS) ×1 IMPLANT
GAUZE XEROFORM 1X8 LF (GAUZE/BANDAGES/DRESSINGS) ×1 IMPLANT
GLOVE BIO SURGEON STRL SZ7.5 (GLOVE) ×1 IMPLANT
GLOVE BIOGEL PI IND STRL 8 (GLOVE) ×2 IMPLANT
GOWN STRL REUS W/ TWL LRG LVL3 (GOWN DISPOSABLE) ×2 IMPLANT
KIT TURNOVER KIT A (KITS) ×1 IMPLANT
MANIFOLD NEPTUNE II (INSTRUMENTS) ×1 IMPLANT
PACK EXTREMITY ARMC (MISCELLANEOUS) IMPLANT
PACK TOTAL KNEE (MISCELLANEOUS) IMPLANT
PAD ABD DERMACEA PRESS 5X9 (GAUZE/BANDAGES/DRESSINGS) ×1 IMPLANT
PAD CAST 4YDX4 CTTN HI CHSV (CAST SUPPLIES) ×3 IMPLANT
PENCIL SMOKE EVACUATOR (MISCELLANEOUS) ×1 IMPLANT
STAPLER SKIN PROX 35W (STAPLE) ×1 IMPLANT
STOCKINETTE STRL 6IN 960660 (GAUZE/BANDAGES/DRESSINGS) ×1 IMPLANT
SUT ETHILON 3-0 (SUTURE) IMPLANT
SUT VIC AB 0 CT1 36 (SUTURE) ×2 IMPLANT
SUT VIC AB 2-0 SH 27XBRD (SUTURE) ×1 IMPLANT
SYR 20ML LL LF (SYRINGE) ×1 IMPLANT
TRAP FLUID SMOKE EVACUATOR (MISCELLANEOUS) ×1 IMPLANT
WATER STERILE IRR 500ML POUR (IV SOLUTION) ×1 IMPLANT

## 2024-03-17 NOTE — Transfer of Care (Signed)
 Immediate Anesthesia Transfer of Care Note  Patient: [name]  Procedure(s) Performed: REMOVAL, HARDWARE (Left: Ankle)  Patient Location: PACU  Anesthesia Type:General  Level of Consciousness: drowsy and patient cooperative  Airway & Oxygen Therapy: Patient Spontanous Breathing and Patient connected to face mask oxygen  Post-op Assessment: Report given to RN and Post -op Vital signs reviewed and stable  Post vital signs: Reviewed and stable  Last Vitals:  Vitals Value Taken Time  BP 102/70 (80)   Temp    Pulse 68 03/17/24 16:22  Resp    SpO2 96 % 03/17/24 16:22  Vitals shown include unfiled device data.  Last Pain:  Vitals:   03/17/24 1342  TempSrc: Temporal  PainSc: 0-No pain         Complications: No notable events documented.

## 2024-03-17 NOTE — Anesthesia Procedure Notes (Signed)
 Procedure Name: LMA Insertion Date/Time: 03/17/2024 3:38 PM  Performed by: Jackye Spanner, CRNAPre-anesthesia Checklist: Patient identified, Patient being monitored, Timeout performed, Emergency Drugs available and Suction available Patient Re-evaluated:Patient Re-evaluated prior to induction Oxygen Delivery Method: Circle system utilized Preoxygenation: Pre-oxygenation with 100% oxygen Induction Type: IV induction Ventilation: Mask ventilation without difficulty LMA: LMA inserted LMA Size: 4.0 Tube type: Oral Number of attempts: 1 Placement Confirmation: positive ETCO2 and breath sounds checked- equal and bilateral Tube secured with: Tape Dental Injury: Teeth and Oropharynx as per pre-operative assessment  Comments: Smooth atraumatic LMA placement, no complications noted.

## 2024-03-17 NOTE — Anesthesia Preprocedure Evaluation (Signed)
 Anesthesia Evaluation  Patient identified by MRN, date of birth, ID band Patient awake    Reviewed: Allergy & Precautions, H&P , NPO status , Patient's Chart, lab work & pertinent test results, reviewed documented beta blocker date and time   Airway Mallampati: II  TM Distance: >3 FB Neck ROM: full    Dental  (+) Teeth Intact   Pulmonary neg pulmonary ROS   Pulmonary exam normal        Cardiovascular Exercise Tolerance: Good hypertension, On Medications negative cardio ROS Normal cardiovascular exam Rate:Normal     Neuro/Psych  Headaches, Seizures -,  PSYCHIATRIC DISORDERS Anxiety Depression       GI/Hepatic Neg liver ROS,GERD  Medicated,,  Endo/Other  negative endocrine ROS    Renal/GU negative Renal ROS  negative genitourinary   Musculoskeletal   Abdominal   Peds  Hematology negative hematology ROS (+)   Anesthesia Other Findings   Reproductive/Obstetrics negative OB ROS                              Anesthesia Physical Anesthesia Plan  ASA: 3  Anesthesia Plan: General LMA   Post-op Pain Management:    Induction:   PONV Risk Score and Plan:   Airway Management Planned:   Additional Equipment:   Intra-op Plan:   Post-operative Plan:   Informed Consent: I have reviewed the patients History and Physical, chart, labs and discussed the procedure including the risks, benefits and alternatives for the proposed anesthesia with the patient or authorized representative who has indicated his/her understanding and acceptance.       Plan Discussed with: CRNA  Anesthesia Plan Comments:         Anesthesia Quick Evaluation

## 2024-03-17 NOTE — Op Note (Signed)
 OPERATIVE NOTE  DATE OF SURGERY:  03/17/2024  PATIENT NAME:  [name]   DOB: 11-Jan-1986  MRN: [id number]   PRE-OPERATIVE DIAGNOSIS: Retained hardware status post ORIF left lateral malleolus  POST-OPERATIVE DIAGNOSIS:  Same  PROCEDURE: Removal of retained hardware (Steinmann pin) from the left lateral malleolus  SURGEON:  Lynwood SHAUNNA Mardee Mickey., M.D.   ASSISTANT: None  ANESTHESIA: general  ESTIMATED BLOOD LOSS: None  FLUIDS REPLACED: 400 mL of crystalloid  TOURNIQUET TIME: Not used  DRAINS: None  INDICATIONS FOR SURGERY: [name] is a 38 y.o. year old female who previously underwent open reduction internal fixation of a distal left tibia fracture and comminuted left lateral malleolar fracture.  Serial radiographs demonstrated progressive backing out of the Steinmann pin from the lateral malleolus.  There was evidence of bony consolidation at the fracture site.  After discussion risk benefits of surgical invention, the patient expressed understanding of the risk benefits and review plans for removal of the retained hardware.    PROCEDURE IN DETAIL: The patient was brought into the operating room and, after adequate general anesthesia was achieved, tourniquet was placed on the patient's left lower leg.  A timeout was performed as per usual protocol.  The patient's left foot and lower leg were cleaned and prepped with alcohol and DuraPrep draped usual sterile fashion.  The distal aspect of the Steinmann pin was localized using FluoroScan.  A small longitudinal incision was made directly over the tip of the Steinmann pin.  Blunt dissection was carried out to the pin site.  The pin was then removed without difficulty using hemostat.  Good hemostasis was noted.  Wound was irrigated with copious amounts of normal saline PramOtic solution.  The skin edges were reapproximated in layers using first #2-0 Vicryl followed by interrupted sutures of #3-0 nylon.  A sterile dressing was  applied.  The patient tolerated the procedure well.  She was transported to the recovery room in stable condition.   Tay Whitwell P. Iran Kievit, Jr. M.D.

## 2024-03-17 NOTE — Interval H&P Note (Signed)
 History and Physical Interval Note:  03/17/2024 3:07 PM  [name]  has presented today for surgery, with the diagnosis of Closed fracture of distal end of left fibula and tibia with routine healing, subsequent encounter S82.302D, D17.167I.  The various methods of treatment have been discussed with the patient and family. After consideration of risks, benefits and other options for treatment, the patient has consented to  Procedure(s) with comments: REMOVAL, HARDWARE (Left) - REMOVAL OF K WIRE FROM THE LEFT ANKLE (LATERAL MALLEOLUS) as a surgical intervention.  The patient's history has been reviewed, patient examined, no change in status, stable for surgery.  I have reviewed the patient's chart and labs.  Questions were answered to the patient's satisfaction.     Caitlynne Harbeck P Yesmin Mutch

## 2024-03-18 ENCOUNTER — Encounter: Payer: Self-pay | Admitting: Orthopedic Surgery

## 2024-03-26 NOTE — Anesthesia Postprocedure Evaluation (Signed)
 Anesthesia Post Note  Patient: [name]  Procedure(s) Performed: REMOVAL, HARDWARE (Left: Ankle)  Patient location during evaluation: PACU Anesthesia Type: General Level of consciousness: awake and alert Pain management: pain level controlled Vital Signs Assessment: post-procedure vital signs reviewed and stable Respiratory status: spontaneous breathing, nonlabored ventilation, respiratory function stable and patient connected to nasal cannula oxygen Cardiovascular status: blood pressure returned to baseline and stable Postop Assessment: no apparent nausea or vomiting Anesthetic complications: no   No notable events documented.   Last Vitals:  Vitals:   03/17/24 1630 03/17/24 1645  BP: (!) 128/96 117/62  Pulse: 91 83  Resp: 17 20  Temp:    SpO2: 98% 97%    Last Pain:  Vitals:   03/17/24 1639  TempSrc:   PainSc: 4                  [name]

## 2024-04-15 ENCOUNTER — Telehealth: Payer: Self-pay

## 2024-04-15 DIAGNOSIS — G4733 Obstructive sleep apnea (adult) (pediatric): Secondary | ICD-10-CM

## 2024-04-15 NOTE — Telephone Encounter (Signed)
 Copied from CRM [id number]. Topic: Clinical - Medication Question >> Apr 15, 2024  1:31 PM Rozanna G wrote: Reason for CRM: PT STATED SHE HAS LOST A GOOD AMOUNT OF WEIGHT, HER MACHINE IS NOT WORKING PROPERLY AND SHE WOULD LIKE FOR SOMEONE TO CALL HER BACK TO DISCUSS WHAT HER OPTIONS ARE ESPECIALLY SINCE SHE HAS LOST WEIGHT

## 2024-04-15 NOTE — Telephone Encounter (Signed)
 Settings have been updated per Dr. Chase recommendation.

## 2024-04-25 DIAGNOSIS — R202 Paresthesia of skin: Secondary | ICD-10-CM | POA: Insufficient documentation

## 2024-05-15 DIAGNOSIS — M5416 Radiculopathy, lumbar region: Secondary | ICD-10-CM | POA: Insufficient documentation

## 2024-06-17 DIAGNOSIS — G4733 Obstructive sleep apnea (adult) (pediatric): Secondary | ICD-10-CM | POA: Insufficient documentation

## 2024-07-22 ENCOUNTER — Ambulatory Visit

## 2024-07-22 DIAGNOSIS — G4733 Obstructive sleep apnea (adult) (pediatric): Secondary | ICD-10-CM

## 2024-09-01 DIAGNOSIS — G4733 Obstructive sleep apnea (adult) (pediatric): Secondary | ICD-10-CM | POA: Diagnosis not present

## 2024-09-10 ENCOUNTER — Ambulatory Visit: Payer: Self-pay

## 2024-10-06 NOTE — Discharge Instructions (Signed)
° °  [facility] °Department Directory °     ° ° ° [email or web address] ° ° °  ° ° °[email or web address] °  ° °      °Cardiology ° °Appointments: °[place] - [phone] °[place] - [phone]  Endocrinology ° °Appointments: °[place] - [phone] °[place] - [phone]  Gastroenterology ° °Appointments: °[place] - [phone] °[place] - [phone]  °      °General Surgery ° ° °Appointments: °[place] - [phone]  Internal Medicine/Family Medicine ° °Appointments: °[place] - [phone] °[place] - [phone] °[place] - [phone]  Metabolic and Weigh Loss Surgery ° °Appointments: °[place] - [phone]  °      °Neurology ° °Appointments: °[place] - [phone] °[place] - [phone]  Neurosurgery ° °Appointments: °[place] - [phone]  Obstetrics & Gynecology ° °Appointments: °[place] - [phone] °[place] - [phone]  °      °Pediatrics ° °Appointments: °[place] - [phone] °[place] - [phone]  Physiatry ° °Appointments: °[place] -[phone]  Physical Therapy ° °Appointments: °[place] - [phone] °[place] - [phone]  °      °Podiatry ° °Appointments: °[place] - [phone] °[place] - [phone]  Pulmonology ° °Appointments: °[place] - [phone]  Rheumatology ° °Appointments: °[place] - [phone]  °      °[place] Location: °[facility]  °[address] °[place], [place]  [address]  [place] Location: °[facility] °[address] °[place], [place]  [address]  [place] Location: °[facility] °[address] °[place], [place]  [address]  °  °

## 2024-10-09 ENCOUNTER — Encounter
Admission: RE | Admit: 2024-10-09 | Discharge: 2024-10-09 | Disposition: A | Discharge status: Home / Self Care | Source: Ambulatory Visit | Attending: Orthopedic Surgery | Admitting: Orthopedic Surgery

## 2024-10-09 ENCOUNTER — Other Ambulatory Visit: Payer: Self-pay

## 2024-10-09 DIAGNOSIS — Z01812 Encounter for preprocedural laboratory examination: Secondary | ICD-10-CM

## 2024-10-09 HISTORY — DX: Sleep apnea, unspecified: G47.30

## 2024-10-09 HISTORY — DX: Pneumonia, unspecified organism: J18.9

## 2024-10-09 NOTE — Patient Instructions (Addendum)
 Your procedure is scheduled on: 10/13/24 - Monday Report to the Registration Desk on the 1st floor of the [facility]. To find out your arrival time, please call [phone] between 1PM - 3PM on: 10/10/24 - Friday If your arrival time is 6:00 am, do not arrive before that time as the [facility] entrance doors do not open until 6:00 am.  REMEMBER: Instructions that are not followed completely may result in serious medical risk, up to and including death; or upon the discretion of your surgeon and anesthesiologist your surgery may need to be rescheduled.  Do not eat food after midnight the night before surgery.  No gum chewing or hard candies.  You may however, drink CLEAR liquids up to 2 hours before you are scheduled to arrive for your surgery. Do not drink anything within 2 hours of your scheduled arrival time.  Clear liquids include: - water  - apple juice without pulp - gatorade (not RED colors) - black coffee or tea (Do NOT add milk or creamers to the coffee or tea) Do NOT drink anything that is not on this list.  In addition, your doctor has ordered for you to drink the provided:  Ensure Pre-Surgery Clear Carbohydrate Drink  Drinking this carbohydrate drink up to two hours before surgery helps to reduce insulin resistance and improve patient outcomes. Please complete drinking 2 hours before scheduled arrival time.  One week prior to surgery: Stop Anti-inflammatories (NSAIDS) such as Advil , Aleve, Ibuprofen , Motrin , Naproxen, Naprosyn and Aspirin  based products such as Excedrin, Goody's Powder, BC Powder. You may take Tylenol  if needed for pain up until the day of surgery.  Stop ANY OVER THE COUNTER supplements until after surgery - Multiple Vitamin   tirzepatide (ZEPBOUND) hold 7 days prior to your surgery.  ON THE DAY OF SURGERY ONLY TAKE THESE MEDICATIONS WITH SIPS OF WATER:  omeprazole (PRILOSEC)   No Alcohol for 24 hours before or after surgery.  No Smoking  including e-cigarettes for 24 hours before surgery.  No chewable tobacco products for at least 6 hours before surgery.  No nicotine patches on the day of surgery.  Do not use any recreational drugs for at least a week (preferably 2 weeks) before your surgery.  Please be advised that the combination of cocaine and anesthesia may have negative outcomes, up to and including death. If you test positive for cocaine, your surgery will be cancelled.  On the morning of surgery brush your teeth with toothpaste and water, you may rinse your mouth with mouthwash if you wish. Do not swallow any toothpaste or mouthwash.  Use CHG Soap or wipes as directed on instruction sheet.  Do not wear jewelry, make-up, hairpins, clips or nail polish.  For welded (permanent) jewelry: bracelets, anklets, waist bands, etc.  Please have this removed prior to surgery.  If it is not removed, there is a chance that hospital personnel will need to cut it off on the day of surgery.  Do not wear lotions, powders, or perfumes.   Do not shave body hair from the neck down 48 hours before surgery.  Contact lenses, hearing aids and dentures may not be worn into surgery.  Do not bring valuables to the hospital. [facility] is not responsible for any missing/lost belongings or valuables.   Notify your doctor if there is any change in your medical condition (cold, fever, infection).  Wear comfortable clothing (specific to your surgery type) to the hospital.  After surgery, you can help prevent  lung complications by doing breathing exercises.  Take deep breaths and cough every 1-2 hours. Your doctor may order a device called an Incentive Spirometer to help you take deep breaths.  If you are being admitted to the hospital overnight, leave your suitcase in the car. After surgery it may be brought to your room.  In case of increased patient census, it may be necessary for you, the patient, to continue your postoperative care in  the Same Day Surgery department.  If you are being discharged the day of surgery, you will not be allowed to drive home. You will need a responsible individual to drive you home and stay with you for 24 hours after surgery.   If you are taking public transportation, you will need to have a responsible individual with you.  Please call the Pre-admissions Testing Dept. at [phone] if you have any questions about these instructions.  Surgery Visitation Policy:  Patients having surgery or a procedure may have two visitors.  Children under the age of 27 must have an adult with them who is not the patient.  Inpatient Visitation:    Visiting hours are 7 a.m. to 8 p.m. Up to four visitors are allowed at one time in a patient room. The visitors may rotate out with other people during the day.  One visitor age 61 or older may stay with the patient overnight and must be in the room by 8 p.m.   Merchandiser, Retail to address health-related social needs:  [email or web address]                                                                                                            Preparing for Surgery with CHLORHEXIDINE  GLUCONATE (CHG) Soap  Chlorhexidine  Gluconate (CHG) Soap  o An antiseptic cleaner that kills germs and bonds with the skin to continue killing germs even after washing  o Used for showering the night before surgery and morning of surgery  Before surgery, you can play an important role by reducing the number of germs on your skin.  CHG (Chlorhexidine  gluconate) soap is an antiseptic cleanser which kills germs and bonds with the skin to continue killing germs even after washing.  Please do not use if you have an allergy to CHG or antibacterial soaps. If your skin becomes reddened/irritated stop using the CHG.  1. Shower the NIGHT BEFORE SURGERY with CHG soap.  2. If you choose to wash your hair, wash your hair first as usual with your normal  shampoo.  3. After shampooing, rinse your hair and body thoroughly to remove the shampoo.  4. Use CHG as you would any other liquid soap. You can apply CHG directly to the skin and wash gently with a clean washcloth.  5. Apply the CHG soap to your body only from the neck down. Do not use on open wounds or open sores. Avoid contact with your eyes, ears, mouth, and genitals (private parts). Wash face and genitals (private parts) with your normal soap.  6. Wash thoroughly, paying special  attention to the area where your surgery will be performed.  7. Thoroughly rinse your body with warm water.  8. Do not shower/wash with your normal soap after using and rinsing off the CHG soap.  9. Do not use lotions, oils, etc., after showering with CHG.  10. Pat yourself dry with a clean towel.  11. Wear clean pajamas to bed the night before surgery.  12. Place clean sheets on your bed the night of your shower and do not sleep with pets.  13. Do not apply any deodorants/lotions/powders.  14. Please wear clean clothes to the hospital.  15. Remember to brush your teeth with your regular toothpaste.

## 2024-10-12 ENCOUNTER — Encounter: Payer: Self-pay | Admitting: Orthopedic Surgery

## 2024-10-12 MED ORDER — LACTATED RINGERS IV SOLN: INTRAVENOUS | Status: DC

## 2024-10-12 MED ORDER — ORAL CARE MOUTH RINSE: 15.0000 mL | Freq: Once | OROMUCOSAL | Status: AC

## 2024-10-12 MED ORDER — CEFAZOLIN SODIUM-DEXTROSE 2-4 GM/100ML-% IV SOLN
2.0000 g | INTRAVENOUS | Status: AC
  Administered 2024-10-13: 2 g via INTRAVENOUS

## 2024-10-12 MED ORDER — CELECOXIB 200 MG PO CAPS
400.0000 mg | ORAL_CAPSULE | Freq: Once | ORAL | Status: AC
  Administered 2024-10-13: 400 mg via ORAL

## 2024-10-12 MED ORDER — CHLORHEXIDINE GLUCONATE 4 % EX SOLN: 60.0000 mL | Freq: Once | CUTANEOUS | Status: DC

## 2024-10-12 MED ORDER — CHLORHEXIDINE GLUCONATE 0.12 % MT SOLN
15.0000 mL | Freq: Once | OROMUCOSAL | Status: AC
  Administered 2024-10-13: 15 mL via OROMUCOSAL

## 2024-10-12 NOTE — H&P (Signed)
 ORTHOPAEDIC HISTORY & PHYSICAL Trammell Bowden, Lynwood Idalia Raddle., MD - 09/23/2024 2:15 PM EST Formatting of this note is different from the original. Images from the original note were not included. Chief Complaint: Chief Complaint Patient presents with Left Lower Leg - Pain, Follow-up Discuss screw removal  Reason for Visit: The patient is a 39 y.o. female who presents today for reevaluation of her left ankle and tibia. She is 1 year status post intramedullary nailing of a left tibial shaft fracture and intramedullary fixation of a left lateral malleolar ankle fracture performed by Dr. Zafonte. She is not using any ambulatory aids. She has been weightbearing as tolerated. She underwent removal of the Rush rod from the distal fibula 6 months ago with resolution of the lateral ankle pain. The lower extremity swelling has resolved and she now complains of pain in the area of the screw heads both proximally and distally. The screw heads are palpable. The skin is intact.  Medications: Current Outpatient Medications Medication Sig Dispense Refill multivitamin tablet Take 1 tablet by mouth once daily omeprazole (PRILOSEC) 40 MG DR capsule Take 1 capsule (40 mg total) by mouth once daily 90 capsule 1 tirzepatide (ZEPBOUND) 2.5 mg/0.5 mL pen injector Inject 0.5 mLs (2.5 mg total) subcutaneously every 7 (seven) days 2 mL 2 ubrogepant  (UBRELVY ) 100 mg Tab Take 100 mg by mouth as directed Take 50-100mg  at headache onset. Can repeat after 2 hours if needed. Do not exceed 200mg  in 24 hours. 16 tablet 5  No current facility-administered medications for this visit.  Allergies: Allergies Allergen Reactions Bupropion Other (See Comments) and Unknown Causes seizures. Seizure  Other reaction(s): OTHER  Seizure Causes seizures. Seizure  Causes seizures.  Seizure  Wellbutrin  Seizure Wellbutrin Bupropion Hcl Other (See Comments) Seizure  Wellbutrin  Past Medical History: Past Medical  History: Diagnosis Date Abnormal Pap smear 2010 colposcopy negative Alcoholic cirrhosis of liver (CMS/HHS-HCC) 12/2011 Dr Viktoria Allergic state pollen, on immunotherapy Anxiety No current psychiatrist, prior wellbutrin, risperdol Gallstones GERD (gastroesophageal reflux disease) 2008 PPI effective Hx of endoscopy 10-04-2012 Dr. Viktoria Hypertension 2011 Improved with weight loss Obstructive sleep apnea syndrome 06/17/2024 Seizures (CMS/HHS-HCC) one episode 2012- medication interaction  Past Surgical History: Past Surgical History: Procedure Laterality Date CHOLECYSTECTOMY 09/11/2013 Intramedullary nailing of left tibial shaft and lateral malleolar fractures 09/10/2023 Dr. Robbin Removal of retained hardware (Steinmann pin) from the left lateral malleolus Left 03/17/2024 Dr. Mardee  Social History: Social History  Socioeconomic History Marital status: Single Spouse name: NA Number of children: 1 Years of education: 12 Highest education level: Bachelor's degree (e.g., BA, AB, BS) Occupational History Occupation: Conservator, museum/gallery Tobacco Use Smoking status: Never Smokeless tobacco: Never Tobacco comments: NA Vaping Use Vaping status: Never Used Substance and Sexual Activity Alcohol use: Not Currently Comment: Prior abuse- starting 2010 Drug use: No Comment: prior use as a teenager- acid, cocaine Sexual activity: Yes Partners: Male Birth control/protection: None Comment: counseled to use condoms Other Topics Concern Would you please tell us  about the people who live in your home, your pets, or anything else important to your social life? No  Social Drivers of Catering Manager Strain: Low Risk (09/23/2024) Overall Financial Resource Strain (CARDIA) Difficulty of Paying Living Expenses: Not hard at all Food Insecurity: No Food Insecurity (09/23/2024) Hunger Vital Sign Worried About Running Out of Food in the Last Year: Never true Ran Out of Food in the  Last Year: Never true Transportation Needs: No Transportation Needs (09/23/2024) PRAPARE - Contractor (Medical):  No Lack of Transportation (Non-Medical): No Physical Activity: Sufficiently Active (03/02/2022) Exercise Vital Sign Days of Exercise per Week: 4 days Minutes of Exercise per Session: 60 min Stress: Stress Concern Present (03/02/2022) [employer] of Occupational Health - Occupational Stress Questionnaire Feeling of Stress : Very much Social Connections: Moderately Integrated (03/02/2022) Social Connection and Isolation Panel Frequency of Communication with Friends and Family: Three times a week Frequency of Social Gatherings with Friends and Family: Once a week Attends Religious Services: More than 4 times per year Active Member of [employer] or Organizations: Yes Attends Engineer, Structural: More than 4 times per year Marital Status: Separated Housing Stability: Low Risk (09/23/2024) Housing Stability Vital Sign Unable to Pay for Housing in the Last Year: No Number of Times Moved in the Last Year: 0 Homeless in the Last Year: No  Family History: Family History Problem Relation Name Age of Onset Anxiety Mother Depression Mother Osteoarthritis Mother Hip fracture Mother Alcohol abuse Mother High blood pressure (Hypertension) Father Obesity Father Depression Father Anxiety Father Coronary Artery Disease (Blocked arteries around heart) Paternal Grandfather High blood pressure (Hypertension) Paternal Grandfather Colon cancer Neg Hx Colon polyps Neg Hx Breast cancer Neg Hx Skin cancer Neg Hx  Review of Systems: A comprehensive 14 point ROS was performed, reviewed, and the pertinent orthopaedic findings are documented in the HPI.  Exam BP 132/74 (BP Location: Left upper arm, Patient Position: Lying, BP Cuff Size: Adult)  Ht 175.3 cm (5' 9.02)  Wt (!) 110.9 kg (244 lb 6.4 oz)  BMI 36.07 kg/m  General: Well-developed,  well-nourished female seen in no acute distress. Even heel to toe gait.  HEENT: Atraumatic, normocephalic. Pupils are equal and reactive to light. Extraocular motion is intact. Sclera are clear. Oropharynx is clear with moist mucosa.  Neck: Supple, nontender, and with good ROM. No thyromegaly, adenopathy, JVD, or carotid bruits.  Lungs: Clear to auscultation bilaterally.  Cardiovascular: Regular rate and rhythm. Normal S1, S2. No murmur . No appreciable gallops or rubs. Peripheral pulses are palpable. No lower extremity edema. Homan`s test is negative.  Left lower leg: Soft tissue swelling: minimal Effusion: none Erythema: none Crepitance: none Tenderness: Screw heads are palpable and tender to the medial aspect of the distal tibia and medial/lateral aspect of the proximal (metaphyseal) tibia Ankle drawer test: negative Incisions: Healing well without evidence of infection. Range of Motion: Good ankle and knee range of motion  Vascular: Peripheral pulses are palpable. Good capillary refill. No gross pretibial or ankle edema. Homans test is negative.  Neurologic: Awake, alert, and oriented. Sensory function is intact to pinprick and light touch with the exception of some numbness to the left 1st and 2nd toes. Motor strength is judged to be 5/5. Motor coordination is within normal limits. No apparent clonus. No tremor.  Radiographs: I ordered and interpreted AP and lateral lateral views of the left tibia/fibula that were obtained in the office today. A tibial nail is in place with locking screws proximally and distally. The previous fracture site has consolidated and remodeled.  Impression: Intramedullary nailing of left tibial shaft fracture with painful retained locking screws  Plan: Operative notes were reviewed. An Arthrex nail with four 5.0 mm cortical locking screws were utilized. The findings were discussed in detail with the patient. The tibia fracture has completely  consolidated. We discussed the risks and benefits of removal of the proximal and distal locking screws. The usual postoperative course was discuss. The patient expressed her understanding of the risks and benefits  of surgical intervention and agreed with plans for removal of the locking screws.  I spent a total of 40 minutes in both face-to-face and non-face-to-face activities, excluding procedures performed, for this visit on the date of this encounter.  SPECIAL EQUIPMENT: None. ACTIVITIES: As tolerated. WORK STATUS: As tolerated. THERAPY: Home exercise program as discussed with the patient. MEDICATIONS: Requested Prescriptions  No prescriptions requested or ordered in this encounter  FOLLOW-UP: Return for preop History & Physical pending surgery date.  Daryana Whirley P. Georgann Bramble, Jr., M.D.  This note was generated in part with voice recognition software and I apologize for any typographical errors that were not detected and corrected.   Electronically signed by Mardee Lynwood Idalia Mickey., MD at 09/28/2024 12:18 PM EST

## 2024-10-13 ENCOUNTER — Other Ambulatory Visit: Payer: Self-pay

## 2024-10-13 ENCOUNTER — Ambulatory Visit

## 2024-10-13 ENCOUNTER — Encounter: Admission: RE | Payer: Self-pay | Source: Home / Self Care

## 2024-10-13 ENCOUNTER — Ambulatory Visit: Payer: Self-pay | Admitting: Urgent Care

## 2024-10-13 ENCOUNTER — Ambulatory Visit: Admitting: Certified Registered Nurse Anesthetist

## 2024-10-13 ENCOUNTER — Ambulatory Visit
Admission: RE | Admit: 2024-10-13 | Discharge: 2024-10-13 | Disposition: A | Attending: Orthopedic Surgery | Admitting: Orthopedic Surgery

## 2024-10-13 ENCOUNTER — Encounter: Payer: Self-pay | Admitting: Orthopedic Surgery

## 2024-10-13 DIAGNOSIS — Y831 Surgical operation with implant of artificial internal device as the cause of abnormal reaction of the patient, or of later complication, without mention of misadventure at the time of the procedure: Secondary | ICD-10-CM | POA: Insufficient documentation

## 2024-10-13 DIAGNOSIS — T8484XA Pain due to internal orthopedic prosthetic devices, implants and grafts, initial encounter: Secondary | ICD-10-CM | POA: Insufficient documentation

## 2024-10-13 DIAGNOSIS — I10 Essential (primary) hypertension: Secondary | ICD-10-CM | POA: Insufficient documentation

## 2024-10-13 DIAGNOSIS — Z01812 Encounter for preprocedural laboratory examination: Secondary | ICD-10-CM

## 2024-10-13 DIAGNOSIS — K219 Gastro-esophageal reflux disease without esophagitis: Secondary | ICD-10-CM | POA: Insufficient documentation

## 2024-10-13 DIAGNOSIS — Z8781 Personal history of (healed) traumatic fracture: Secondary | ICD-10-CM | POA: Insufficient documentation

## 2024-10-13 DIAGNOSIS — G4733 Obstructive sleep apnea (adult) (pediatric): Secondary | ICD-10-CM | POA: Insufficient documentation

## 2024-10-13 DIAGNOSIS — Z9889 Other specified postprocedural states: Secondary | ICD-10-CM

## 2024-10-13 LAB — POCT PREGNANCY, URINE: Preg Test, Ur: NEGATIVE

## 2024-10-13 MED ORDER — BUPIVACAINE-EPINEPHRINE (PF) 0.25% -1:200000 IJ SOLN
INTRAMUSCULAR | Status: AC
  Filled 2024-10-13: qty 30

## 2024-10-13 MED ORDER — PROPOFOL 1000 MG/100ML IV EMUL
INTRAVENOUS | Status: AC
  Filled 2024-10-13: qty 100

## 2024-10-13 MED ORDER — LIDOCAINE HCL (PF) 2 % IJ SOLN
INTRAMUSCULAR | Status: AC
  Filled 2024-10-13: qty 5

## 2024-10-13 MED ORDER — HYDROCODONE-ACETAMINOPHEN 5-325 MG PO TABS
1.0000 | ORAL_TABLET | Freq: Four times a day (QID) | ORAL | 0 refills | Status: AC | PRN
  Filled 2024-10-13: qty 10, 3d supply, fill #0

## 2024-10-13 MED ORDER — HYDROCODONE-ACETAMINOPHEN 5-325 MG PO TABS: 1.0000 | ORAL_TABLET | ORAL | Status: DC | PRN

## 2024-10-13 MED ORDER — MIDAZOLAM HCL (PF) 2 MG/2ML IJ SOLN
INTRAMUSCULAR | Status: DC | PRN
  Administered 2024-10-13: 2 mg via INTRAVENOUS

## 2024-10-13 MED ORDER — METOCLOPRAMIDE HCL 5 MG/ML IJ SOLN: 5.0000 mg | Freq: Three times a day (TID) | INTRAMUSCULAR | Status: DC | PRN

## 2024-10-13 MED ORDER — BUPIVACAINE HCL (PF) 0.25 % IJ SOLN
INTRAMUSCULAR | Status: AC
  Filled 2024-10-13: qty 30

## 2024-10-13 MED ORDER — LIDOCAINE HCL (CARDIAC) PF 100 MG/5ML IV SOSY
PREFILLED_SYRINGE | INTRAVENOUS | Status: DC | PRN
  Administered 2024-10-13: 100 mg via INTRAVENOUS

## 2024-10-13 MED ORDER — ONDANSETRON HCL 4 MG PO TABS: 4.0000 mg | ORAL_TABLET | Freq: Four times a day (QID) | ORAL | Status: DC | PRN

## 2024-10-13 MED ORDER — METOCLOPRAMIDE HCL 10 MG PO TABS: 5.0000 mg | ORAL_TABLET | Freq: Three times a day (TID) | ORAL | Status: DC | PRN

## 2024-10-13 MED ORDER — ONDANSETRON HCL 4 MG/2ML IJ SOLN
INTRAMUSCULAR | Status: DC | PRN
  Administered 2024-10-13: 4 mg via INTRAVENOUS

## 2024-10-13 MED ORDER — MORPHINE SULFATE (PF) 2 MG/ML IV SOLN: 0.5000 mg | INTRAVENOUS | Status: DC | PRN

## 2024-10-13 MED ORDER — DEXAMETHASONE SOD PHOSPHATE PF 10 MG/ML IJ SOLN
INTRAMUSCULAR | Status: AC
  Filled 2024-10-13: qty 1

## 2024-10-13 MED ORDER — ACETAMINOPHEN 325 MG PO TABS: 325.0000 mg | ORAL_TABLET | Freq: Four times a day (QID) | ORAL | Status: DC | PRN

## 2024-10-13 MED ORDER — DEXAMETHASONE SOD PHOSPHATE PF 10 MG/ML IJ SOLN
INTRAMUSCULAR | Status: DC | PRN
  Administered 2024-10-13: 10 mg via INTRAVENOUS

## 2024-10-13 MED ORDER — ENSURE PRE-SURGERY PO LIQD
296.0000 mL | Freq: Once | ORAL | Status: AC
  Administered 2024-10-13: 296 mL via ORAL

## 2024-10-13 MED ORDER — DROPERIDOL 2.5 MG/ML IJ SOLN: 0.6250 mg | Freq: Once | INTRAMUSCULAR | Status: DC | PRN

## 2024-10-13 MED ORDER — PROPOFOL 500 MG/50ML IV EMUL
INTRAVENOUS | Status: DC | PRN
  Administered 2024-10-13: 150 ug/kg/min via INTRAVENOUS

## 2024-10-13 MED ORDER — FENTANYL CITRATE (PF) 100 MCG/2ML IJ SOLN
INTRAMUSCULAR | Status: AC
  Filled 2024-10-13: qty 2

## 2024-10-13 MED ORDER — ONDANSETRON HCL 4 MG/2ML IJ SOLN: 4.0000 mg | Freq: Four times a day (QID) | INTRAMUSCULAR | Status: DC | PRN

## 2024-10-13 MED ORDER — FENTANYL CITRATE (PF) 100 MCG/2ML IJ SOLN
INTRAMUSCULAR | Status: DC | PRN
  Administered 2024-10-13 (×2): 100 ug via INTRAVENOUS

## 2024-10-13 MED ORDER — 0.9 % SODIUM CHLORIDE (POUR BTL) OPTIME
TOPICAL | Status: DC | PRN
  Administered 2024-10-13: 500 mL

## 2024-10-13 MED ORDER — CHLORHEXIDINE GLUCONATE 0.12 % MT SOLN
OROMUCOSAL | Status: AC
  Filled 2024-10-13: qty 15

## 2024-10-13 MED ORDER — BUPIVACAINE-EPINEPHRINE 0.25% -1:200000 IJ SOLN
INTRAMUSCULAR | Status: DC | PRN
  Administered 2024-10-13: 20 mL

## 2024-10-13 MED ORDER — MIDAZOLAM HCL 2 MG/2ML IJ SOLN
INTRAMUSCULAR | Status: AC
  Filled 2024-10-13: qty 2

## 2024-10-13 MED ORDER — CELECOXIB 200 MG PO CAPS
ORAL_CAPSULE | ORAL | Status: AC
  Filled 2024-10-13: qty 2

## 2024-10-13 MED ORDER — ACETAMINOPHEN 10 MG/ML IV SOLN
INTRAVENOUS | Status: AC
  Filled 2024-10-13: qty 100

## 2024-10-13 MED ORDER — PROPOFOL 10 MG/ML IV BOLUS
INTRAVENOUS | Status: AC
  Filled 2024-10-13: qty 20

## 2024-10-13 MED ORDER — CEFAZOLIN SODIUM-DEXTROSE 2-4 GM/100ML-% IV SOLN
INTRAVENOUS | Status: AC
  Filled 2024-10-13: qty 100

## 2024-10-13 MED ORDER — DEXMEDETOMIDINE HCL IN NACL 80 MCG/20ML IV SOLN
INTRAVENOUS | Status: DC | PRN
  Administered 2024-10-13: 8 ug via INTRAVENOUS

## 2024-10-13 MED ORDER — FENTANYL CITRATE (PF) 100 MCG/2ML IJ SOLN
25.0000 ug | INTRAMUSCULAR | Status: AC | PRN
  Administered 2024-10-13 (×8): 25 ug via INTRAVENOUS

## 2024-10-13 MED ORDER — ACETAMINOPHEN 10 MG/ML IV SOLN: 1000.0000 mg | Freq: Once | INTRAVENOUS | Status: DC | PRN

## 2024-10-13 MED ORDER — ACETAMINOPHEN 10 MG/ML IV SOLN
INTRAVENOUS | Status: DC | PRN
  Administered 2024-10-13: 1000 mg via INTRAVENOUS

## 2024-10-13 MED ORDER — PROPOFOL 10 MG/ML IV BOLUS
INTRAVENOUS | Status: DC | PRN
  Administered 2024-10-13: 50 mg via INTRAVENOUS
  Administered 2024-10-13: 200 mg via INTRAVENOUS

## 2024-10-13 MED ORDER — OXYCODONE HCL 5 MG PO TABS
ORAL_TABLET | ORAL | Status: AC
  Filled 2024-10-13: qty 1

## 2024-10-13 MED ORDER — OXYCODONE HCL 5 MG PO TABS
5.0000 mg | ORAL_TABLET | Freq: Once | ORAL | Status: AC | PRN
  Administered 2024-10-13: 5 mg via ORAL

## 2024-10-13 MED ORDER — OXYCODONE HCL 5 MG/5ML PO SOLN: 5.0000 mg | Freq: Once | ORAL | Status: AC | PRN

## 2024-10-13 MED ORDER — ONDANSETRON HCL 4 MG/2ML IJ SOLN
INTRAMUSCULAR | Status: AC
  Filled 2024-10-13: qty 2

## 2024-10-13 MED ORDER — SODIUM CHLORIDE 0.9 % IV SOLN: INTRAVENOUS | Status: DC

## 2024-10-13 MED ORDER — HYDROCODONE-ACETAMINOPHEN 7.5-325 MG PO TABS: 1.0000 | ORAL_TABLET | ORAL | Status: DC | PRN

## 2024-10-13 NOTE — Anesthesia Postprocedure Evaluation (Signed)
"   Anesthesia Post Note  Patient: [name]  Procedure(s) Performed: REMOVAL, HARDWARE (Left: Ankle)  Patient location during evaluation: PACU Anesthesia Type: General Level of consciousness: awake and alert Pain management: pain level controlled Vital Signs Assessment: post-procedure vital signs reviewed and stable Respiratory status: spontaneous breathing, nonlabored ventilation and respiratory function stable Cardiovascular status: blood pressure returned to baseline and stable Postop Assessment: no apparent nausea or vomiting Anesthetic complications: no   No notable events documented.   Last Vitals:  Vitals:   10/13/24 1810 10/13/24 1815  BP:  116/84  Pulse: 64 72  Resp: 14 20  Temp:    SpO2: 94% 94%    Last Pain:  Vitals:   10/13/24 1810  PainSc: 5                  Quency Tober Merilee Louder      "

## 2024-10-13 NOTE — Anesthesia Preprocedure Evaluation (Signed)
"                                    Anesthesia Evaluation  Patient identified by MRN, date of birth, ID band Patient awake    Reviewed: Allergy & Precautions, H&P , NPO status , Patient's Chart, lab work & pertinent test results, reviewed documented beta blocker date and time   Airway Mallampati: II  TM Distance: >3 FB Neck ROM: full    Dental  (+) Teeth Intact   Pulmonary sleep apnea , pneumonia, resolved   Pulmonary exam normal        Cardiovascular Exercise Tolerance: Good hypertension, On Medications negative cardio ROS Normal cardiovascular exam Rate:Normal     Neuro/Psych  Headaches, Seizures -,  PSYCHIATRIC DISORDERS Anxiety Depression     Neuromuscular disease    GI/Hepatic Neg liver ROS,GERD  Medicated,,  Endo/Other  negative endocrine ROS    Renal/GU negative Renal ROS  negative genitourinary   Musculoskeletal   Abdominal   Peds  Hematology negative hematology ROS (+)   Anesthesia Other Findings   Reproductive/Obstetrics negative OB ROS                              Anesthesia Physical Anesthesia Plan  ASA: 3  Anesthesia Plan: General LMA   Post-op Pain Management:    Induction:   PONV Risk Score and Plan:   Airway Management Planned:   Additional Equipment:   Intra-op Plan:   Post-operative Plan:   Informed Consent: I have reviewed the patients History and Physical, chart, labs and discussed the procedure including the risks, benefits and alternatives for the proposed anesthesia with the patient or authorized representative who has indicated his/her understanding and acceptance.       Plan Discussed with: CRNA  Anesthesia Plan Comments:         Anesthesia Quick Evaluation  "

## 2024-10-13 NOTE — Transfer of Care (Signed)
 Immediate Anesthesia Transfer of Care Note  Patient: [name]  Procedure(s) Performed: REMOVAL, HARDWARE (Left: Ankle)  Patient Location: PACU  Anesthesia Type:General  Level of Consciousness: awake  Airway & Oxygen Therapy: Patient Spontanous Breathing  Post-op Assessment: Report given to RN and Post -op Vital signs reviewed and stable  Post vital signs: Reviewed  Last Vitals:  Vitals Value Taken Time  BP 114/71 10/13/24 17:21  Temp    Pulse 87 10/13/24 17:24  Resp 25 10/13/24 17:24  SpO2 94% 10/13/24 17:24  Vitals shown include unfiled device data.  Last Pain:         Complications: No notable events documented.

## 2024-10-13 NOTE — Anesthesia Procedure Notes (Signed)
 Procedure Name: LMA Insertion Date/Time: 10/13/2024 3:04 PM  Performed by: Brien Sotero PARAS, CRNAPre-anesthesia Checklist: Patient identified, Emergency Drugs available, Suction available, Patient being monitored and Timeout performed Patient Re-evaluated:Patient Re-evaluated prior to induction Oxygen Delivery Method: Circle system utilized Preoxygenation: Pre-oxygenation with 100% oxygen Induction Type: IV induction LMA: LMA inserted LMA Size: 4.0

## 2024-10-13 NOTE — Interval H&P Note (Signed)
 History and Physical Interval Note:  10/13/2024 11:31 AM  [name]  has presented today for surgery, with the diagnosis of Pain from implanted hardware, subsequent encounter T85.848D Closed fracture of distal end of left fibula and tibia with routine healing, subsequent encounter S82.302D, S82.832D S/P hardware removal Z98.890.  The various methods of treatment have been discussed with the patient and family. After consideration of risks, benefits and other options for treatment, the patient has consented to  Procedures: REMOVAL, HARDWARE (Left) as a surgical intervention.  The patient's history has been reviewed, patient examined, no change in status, stable for surgery.  I have reviewed the patient's chart and labs.  Questions were answered to the patient's satisfaction.     Kylen Ismael P Ryin Schillo

## 2024-10-14 ENCOUNTER — Encounter: Payer: Self-pay | Admitting: Orthopedic Surgery
